# Patient Record
Sex: Female | Born: 1995 | Race: White | Hispanic: No | Marital: Married | Smoking: Former smoker
Health system: Southern US, Community
[De-identification: ages and names within clinical notes are randomized; demographics above are authoritative.]

## PROBLEM LIST (undated history)

## (undated) DIAGNOSIS — R7303 Prediabetes: Secondary | ICD-10-CM

## (undated) HISTORY — PX: WISDOM TOOTH EXTRACTION: SHX21

---

## 2020-03-14 ENCOUNTER — Emergency Department (HOSPITAL_COMMUNITY): Payer: BC Managed Care – PPO

## 2020-03-14 ENCOUNTER — Emergency Department (HOSPITAL_COMMUNITY)
Admission: EM | Admit: 2020-03-14 | Discharge: 2020-03-14 | Disposition: A | Discharge status: Home / Self Care | Payer: BC Managed Care – PPO | Attending: Emergency Medicine | Admitting: Emergency Medicine

## 2020-03-14 ENCOUNTER — Encounter (HOSPITAL_COMMUNITY): Payer: Self-pay

## 2020-03-14 ENCOUNTER — Other Ambulatory Visit: Payer: Self-pay

## 2020-03-14 DIAGNOSIS — S93401A Sprain of unspecified ligament of right ankle, initial encounter: Secondary | ICD-10-CM | POA: Insufficient documentation

## 2020-03-14 DIAGNOSIS — S99911A Unspecified injury of right ankle, initial encounter: Secondary | ICD-10-CM | POA: Diagnosis present

## 2020-03-14 DIAGNOSIS — X501XXA Overexertion from prolonged static or awkward postures, initial encounter: Secondary | ICD-10-CM | POA: Diagnosis not present

## 2020-03-14 LAB — I-STAT BETA HCG BLOOD, ED (MC, WL, AP ONLY): I-stat hCG, quantitative: <5 m[IU]/mL (ref <5)

## 2020-03-14 LAB — I-STAT CREATININE, ED: Creatinine, Ser: 0.7 mg/dL (ref 0.44–1.00)

## 2020-03-14 MED ORDER — IOHEXOL 350 MG/ML SOLN
100.0000 mL | Freq: Once | INTRAVENOUS | Status: AC | PRN
  Administered 2020-03-14: 100 mL via INTRAVENOUS

## 2020-03-14 NOTE — ED Provider Notes (Signed)
Contacted by the urgent care provider who sent this patient in for evaluation. XR of right ankle taken prior to sending to ED. I was called with results of XR, read as concern for ligamentous injury of the ankle. I do not have access to the images or report in Epic of via [facility]. Patient is still in the waiting room at this time.     Allayne Butcher, MD 03/14/20 1656    Terald Sleeper, MD 03/14/20 2145

## 2020-03-14 NOTE — ED Notes (Signed)
Pt discharged from ED in NAD, discussed discharge instructions with patient

## 2020-03-14 NOTE — Progress Notes (Signed)
Orthopedic Tech Progress Note Patient Details:  Kathleen Crawford 1995-07-29 [id number]  Ortho Devices Type of Ortho Device: ASO Ortho Device/Splint Location: Right Lower Extremity Ortho Device/Splint Interventions: Ordered, Application   Post Interventions Patient Tolerated: Well Instructions Provided: Adjustment of device, Care of device, Poper ambulation with device   Gerald Stabs 03/14/2020, 6:35 PM

## 2020-03-14 NOTE — ED Provider Notes (Signed)
[facility] EMERGENCY DEPARTMENT Provider Note   CSN: [id number] Arrival date & time: 03/14/20  1327     History No chief complaint on file.   Kathleen Crawford is a 24 y.o. female presenting for evaluation of right ankle sprain.  Patient states last night she missed a step, and sharply inverted her right ankle.  Since then, she has had pain on the lateral ankle.  She was seen at urgent care, there was concern for possible vascular injury due to coolness of the foot, burning, tingling.  Patient reports injury elsewhere, including knee, hip, or head.  She took ibuprofen this morning for pain, she has not taken anything else.  She has no other medical problems, takes no medications daily.  Does not have orthopedic doctor.  Pain is constant, worse with movement and palpation.  Nothing makes it better.  She reports a tingling sensation in the foot, but no true numbness.  HPI     History reviewed. No pertinent past medical history.  There are no problems to display for this patient.   History reviewed. No pertinent surgical history.   OB History   No obstetric history on file.     No family history on file.  Social History   Tobacco Use  . Smoking status: Not on file  Substance Use Topics  . Alcohol use: Not on file  . Drug use: Not on file    Home Medications Prior to Admission medications   Not on File    Allergies    Patient has no known allergies.  Review of Systems   Review of Systems  Musculoskeletal: Positive for arthralgias and joint swelling.  Neurological: Negative for numbness.    Physical Exam Updated Vital Signs BP 114/80 (BP Location: Left Arm)   Pulse 72   Temp 98.7 F (37.1 C) (Oral)   Resp 16   SpO2 100%   Physical Exam Vitals and nursing note reviewed.  Constitutional:      General: She is not in acute distress.    Appearance: She is well-developed.  HENT:     Head: Normocephalic and atraumatic.  Pulmonary:      Effort: Pulmonary effort is normal.  Abdominal:     General: There is no distension.  Musculoskeletal:        General: Swelling and tenderness present.     Cervical back: Normal range of motion.     Comments: Obvious swelling over the lateral malleolus of the right foot.  Tenderness palpation.  Pedal pulse 2+ bilaterally.  Good distal sensation and cap refill.  Patient reports decrease in station on the right foot, but no numbness.  Both feet are cool, right foot slightly cooler.  Achilles tendon is palpable and intact.  No pain on the medial malleolus.  No pain of the calcaneus or foot itself.  Skin:    General: Skin is warm.     Capillary Refill: Capillary refill takes less than 2 seconds.     Findings: No rash.  Neurological:     Mental Status: She is alert and oriented to person, place, and time.     ED Results / Procedures / Treatments   Labs (all labs ordered are listed, but only abnormal results are displayed) Labs Reviewed  I-STAT CREATININE, ED  I-STAT BETA HCG BLOOD, ED ([facility], [facility], [facility] ONLY)    EKG None  Radiology CT ANGIO LOW EXTREM LEFT W &/OR WO CONTRAST  Result Date: 03/14/2020 CLINICAL DATA:  Rolled ankle,  right foot numbness, evaluate for vascular injury EXAM: CT ANGIOGRAPHY LOWER RIGHT EXTREMITY; CT ANGIOGRAPHY LOWER LEFT EXTREMITY TECHNIQUE: CT angiogram of the bilateral lower extremities was performed following the intravenous injection of iodinated contrast. Multiplanar reformats were performed. CONTRAST:  OMNIPAQUE IOHEXOL 350 MG/ML SOLN COMPARISON:  None. FINDINGS: Vascular: Normal contour and caliber of the bilateral lower extremity arterial vasculature. Three-vessel runoff to the ankles bilaterally. No evidence of traumatic arterial vascular injury to the lower extremities. Musculoskeletal: Soft tissue edema about the lateral ankle. No fracture or dislocation. Unremarkable appearance of the musculature and soft tissues of the lower extremities. Review of the  MIP images confirms the above findings. IMPRESSION: 1. Normal contour and caliber of the bilateral lower extremity arterial vasculature. Three-vessel runoff to the ankles bilaterally. No evidence of traumatic arterial vascular injury to the lower extremities. 2. Soft tissue edema about the lateral ankle. Consider MRI on a nonemergent basis to assess for ligamentous injury. 3. No fracture or dislocation. Electronically Signed   By: Lauralyn Primes M.D.   On: 03/14/2020 17:29   CT ANGIO LOW EXTREM RIGHT W &/OR WO CONTRAST  Result Date: 03/14/2020 CLINICAL DATA:  Rolled ankle, right foot numbness, evaluate for vascular injury EXAM: CT ANGIOGRAPHY LOWER RIGHT EXTREMITY; CT ANGIOGRAPHY LOWER LEFT EXTREMITY TECHNIQUE: CT angiogram of the bilateral lower extremities was performed following the intravenous injection of iodinated contrast. Multiplanar reformats were performed. CONTRAST:  OMNIPAQUE IOHEXOL 350 MG/ML SOLN COMPARISON:  None. FINDINGS: Vascular: Normal contour and caliber of the bilateral lower extremity arterial vasculature. Three-vessel runoff to the ankles bilaterally. No evidence of traumatic arterial vascular injury to the lower extremities. Musculoskeletal: Soft tissue edema about the lateral ankle. No fracture or dislocation. Unremarkable appearance of the musculature and soft tissues of the lower extremities. Review of the MIP images confirms the above findings. IMPRESSION: 1. Normal contour and caliber of the bilateral lower extremity arterial vasculature. Three-vessel runoff to the ankles bilaterally. No evidence of traumatic arterial vascular injury to the lower extremities. 2. Soft tissue edema about the lateral ankle. Consider MRI on a nonemergent basis to assess for ligamentous injury. 3. No fracture or dislocation. Electronically Signed   By: Lauralyn Primes M.D.   On: 03/14/2020 17:29    Procedures Procedures (including critical care time)  Medications Ordered in ED Medications    iohexol (OMNIPAQUE) 350 MG/ML injection 100 mL (100 mLs Intravenous Contrast Given 03/14/20 1635)    ED Course  I have reviewed the triage vital signs and the nursing notes.  Pertinent labs & imaging results that were available during my care of the patient were reviewed by me and considered in my medical decision making (see chart for details).    MDM Rules/Calculators/A&P                          Patient presenting for evaluation of right ankle pain.  On exam, patient appears nontoxic.  She is neurovascularly intact.  The patient's foot is cool, this is likely due to lack of use.  Patient has a good pedal pulse.  CTA obtained from triage without signs of vascular injury or subtle bony injury.  Cannot rule out ligamentous injury.  Discussed findings with patient.  Discussed symptomatic treatment with rice therapy and ASO.  Encouraged patient to follow-up with orthopedics as needed.  At this time, patient appears safe for discharge.  Return precautions given.  Patient states she understands and agrees to plan.  Final Clinical  Impression(s) / ED Diagnoses Final diagnoses:  Sprain of right ankle, unspecified ligament, initial encounter    Rx / DC Orders ED Discharge Orders    None       Alveria Apley, PA-C 03/14/20 1759    Virgina Norfolk, DO 03/14/20 1832

## 2020-03-14 NOTE — Discharge Instructions (Addendum)
Take ibuprofen 3 times a day with meals.  Do not take other anti-inflammatories at the same time (Advil, Motrin, naproxen, Aleve). You may supplement with Tylenol if you need further pain control. Use ice packs, 20 minutes at a time, up to 3 times a day as needed for pain. Keep your foot elevated to help with pain and swelling. Use the ankle brace as needed for pain control. I recommend you use crutches in the first couple days to help with pain control.  After this, try to slowly progress weightbearing as tolerated. Follow-up with orthopedic doctor in 1 week if your symptoms not proving. Return to emergency room with any new, worsening, concerning symptoms.

## 2020-03-14 NOTE — ED Triage Notes (Signed)
Patient sent from urgent care for further evaluation of right ankle injury that occurred last night after rolling ankle. Xray negative per patient. Extremity cool to touch, positive distal pulse. Complains of numbness and burning

## 2020-03-14 NOTE — ED Provider Notes (Signed)
MSE was initiated and I personally evaluated the patient and placed orders (if any) at  2:50 PM on March 14, 2020.  24 year old female presents to the emergency department from urgent care.  Sent for evaluation of possible vascular injury to the right lower extremity.  States that she rolled her ankle last night at her brother's 22st birthday.  Went for x-rays at urgent care today and was told they were negative.  Provider expressed concern about possible vascular injury due to cool extremity and delayed capillary refill.  On my assessment, the patient does have some slight coolness of the right foot as compared to the left.  Her capillary refill in her lesser toes is slightly delayed, but she has a strong, palpable 2+ DP pulse bilaterally.  There is minimal swelling about the right lateral malleolus.  No crepitus or deformity.  Suspect there may be a degree of Raynaud's phenomenon affecting her distal right foot.  Despite low suspicion, will obtain CTA of the RLE to exclude arterial injury; will also allow for further bony trauma. If negative, feel the patient can be discharged with RICE precautions.  The patient appears stable so that the remainder of the MSE may be completed by another provider.   Antony Madura, PA-C 03/14/20 1453    Gerhard Munch, MD 03/14/20 [phone]

## 2022-01-05 IMAGING — CT CT ANGIO EXTREM LOW*L*
2 of 8 series · 14 of 46 positions shown, 19 images · IV contrast (APPLIED)
Comparison: None.

CLINICAL DATA: Rolled ankle, right foot numbness, evaluate for
vascular injury

EXAM:
CT ANGIOGRAPHY LOWER RIGHT EXTREMITY; CT ANGIOGRAPHY LOWER LEFT
EXTREMITY
TECHNIQUE: CT angiogram of the bilateral lower extremities was performed
following the intravenous injection of iodinated contrast.
Multiplanar reformats were performed.
CONTRAST:  100mL OMNIPAQUE IOHEXOL 350 MG/ML SOLN

[Series 6: angiorunoff 1.0 i30f 3 · axial · 0.98mm/px · z∈[-1256,-236]mm · 12 of 1174 slices shown, 16 images]
[im 103/1174  soft-tissue]
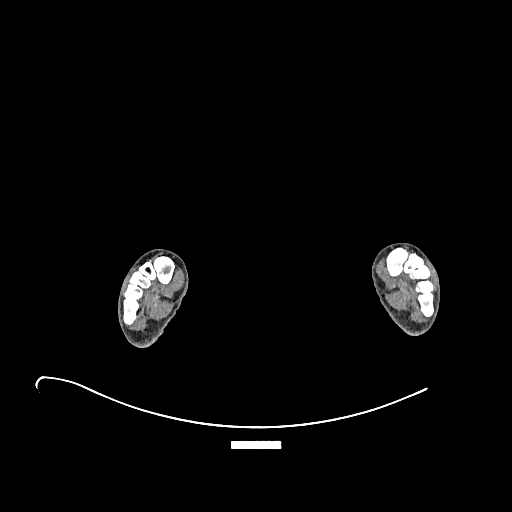
[im 103/1174  bone]
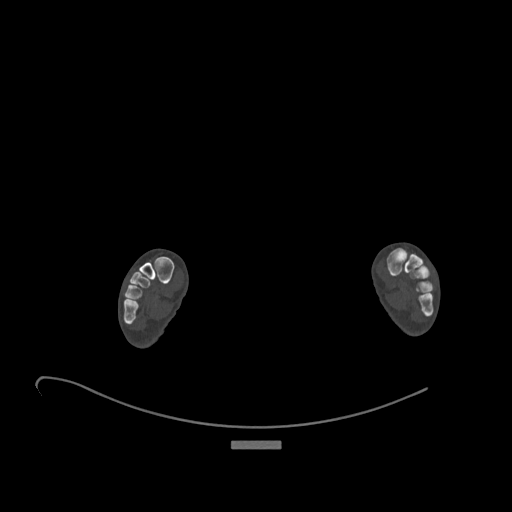
[im 205/1174  soft-tissue]
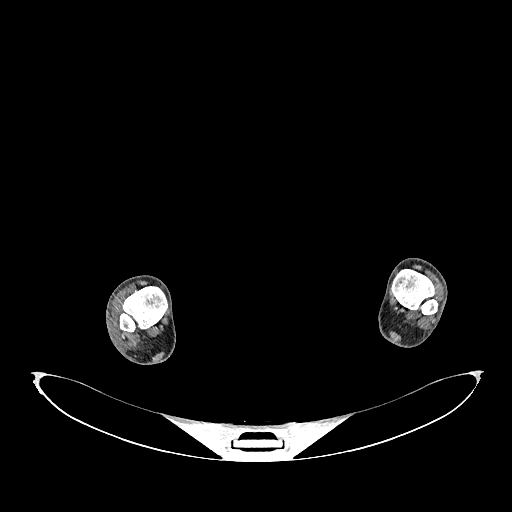
[im 307/1174  soft-tissue]
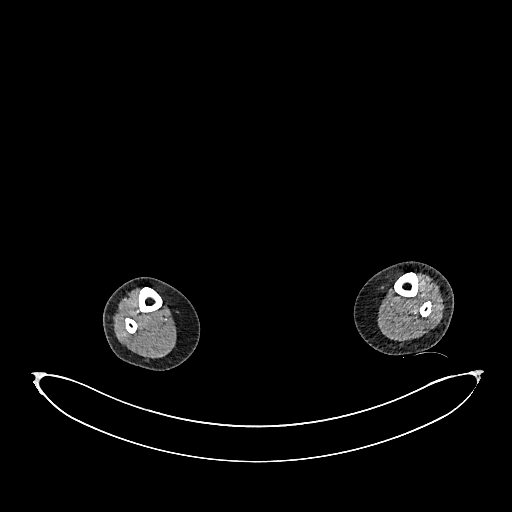
[im 409/1174  soft-tissue]
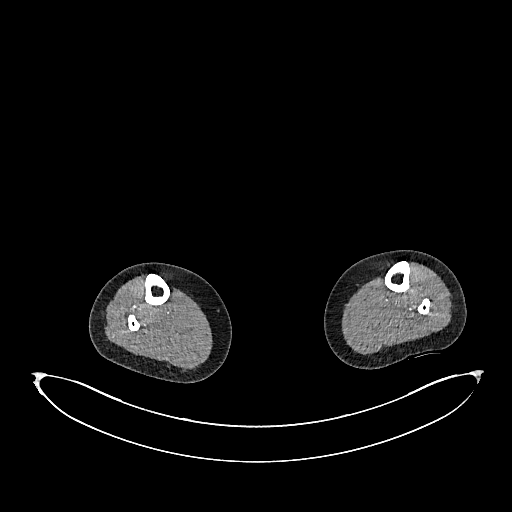
[im 511/1174  soft-tissue]
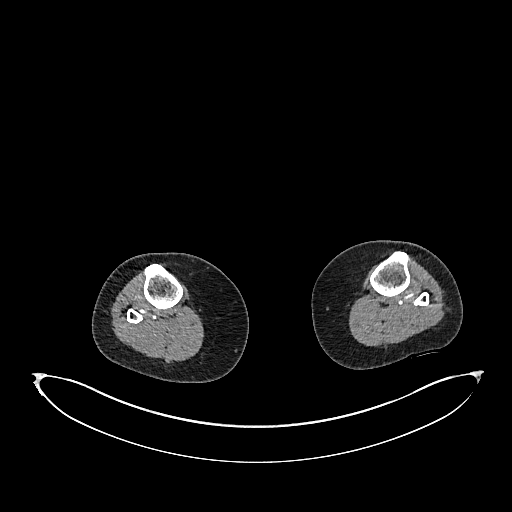
[im 664/1174  soft-tissue]
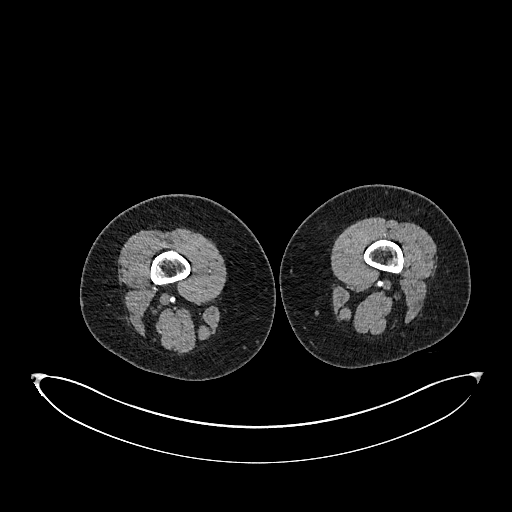
[im 766/1174  soft-tissue]
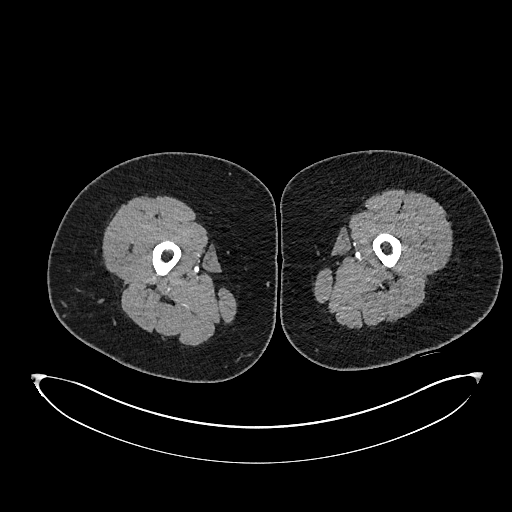
[im 868/1174  soft-tissue]
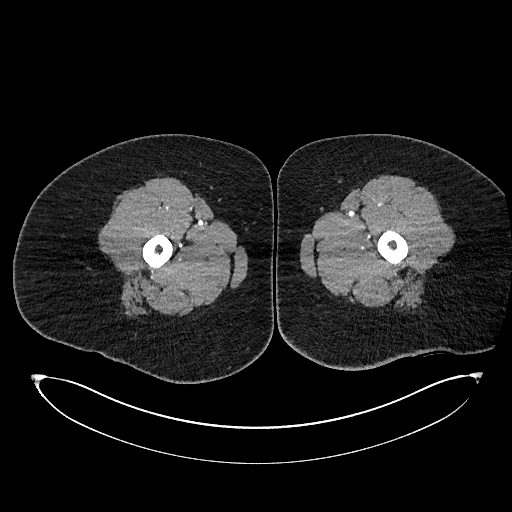
[im 970/1174  soft-tissue]
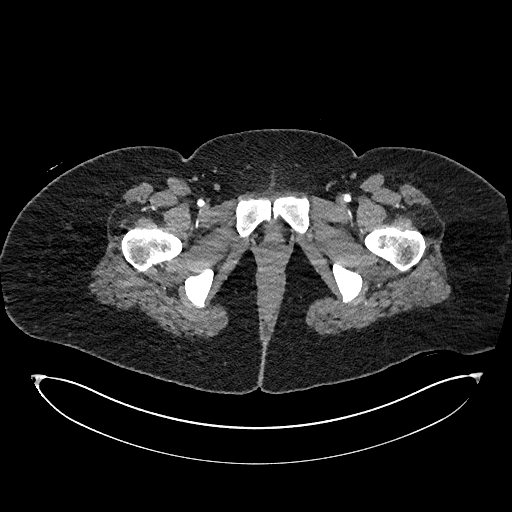
[im 970/1174  lung]
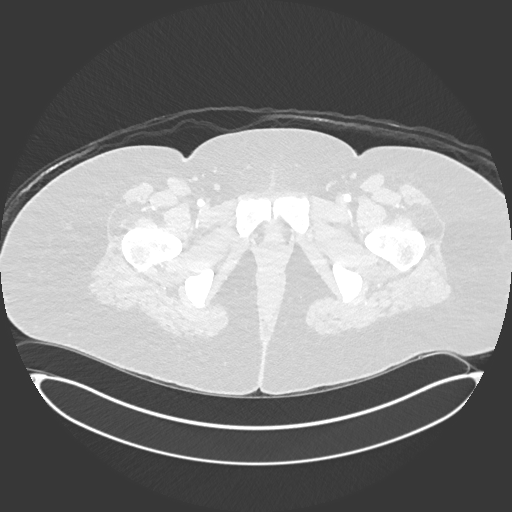
[im 970/1174  bone]
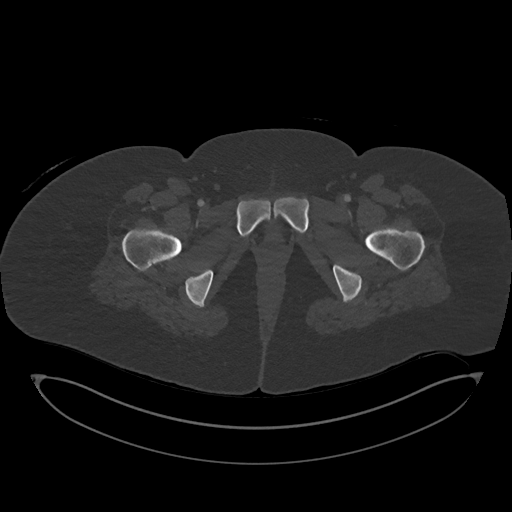
[im 1021/1174  lung]
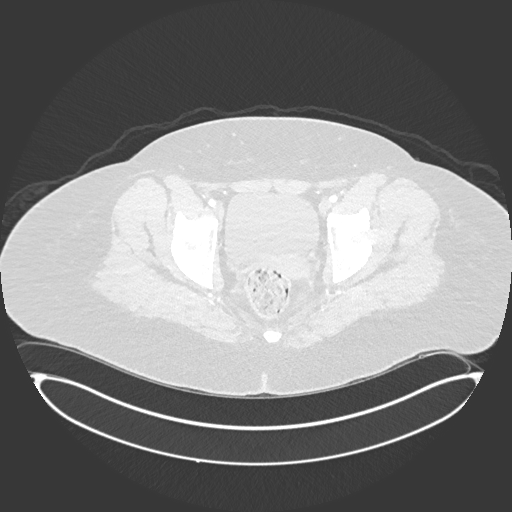
[im 1072/1174  soft-tissue]
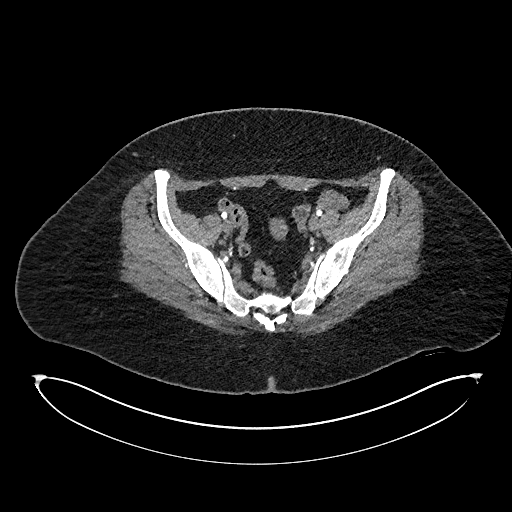
[im 1072/1174  lung]
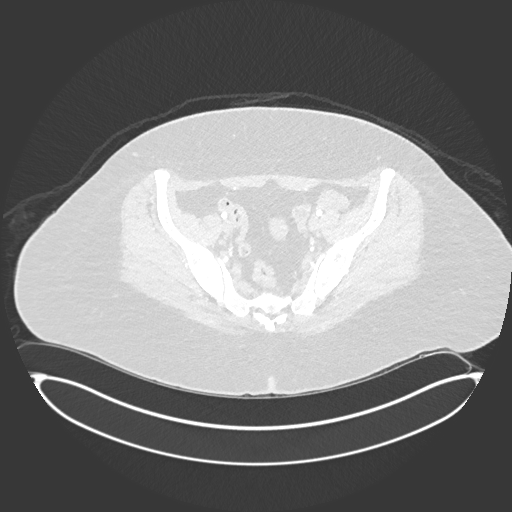
[im 1123/1174  lung]
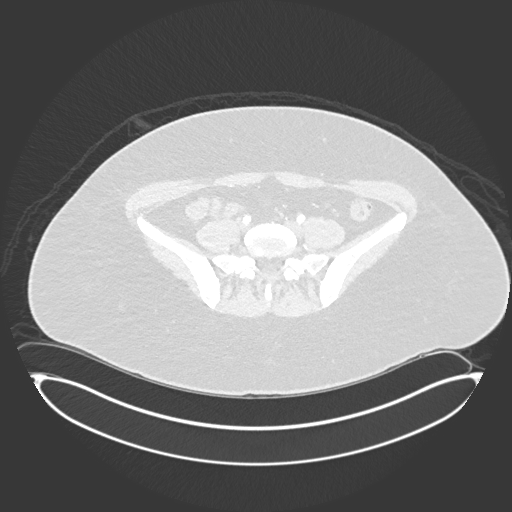

[Series 7: coronal upper · coronal · 0.98mm/px · 2 of 151 slices shown, 3 images]
[im 51/151  soft-tissue]
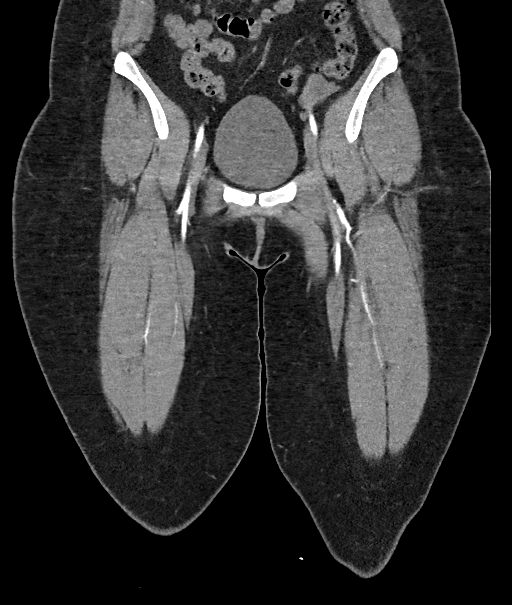
[im 51/151  bone]
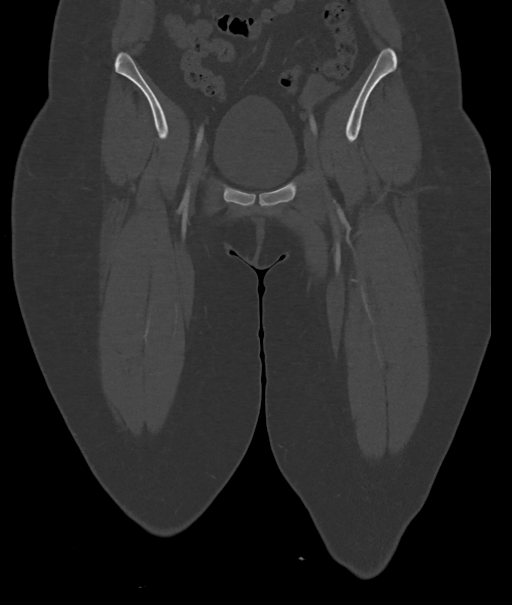
[im 101/151  soft-tissue]
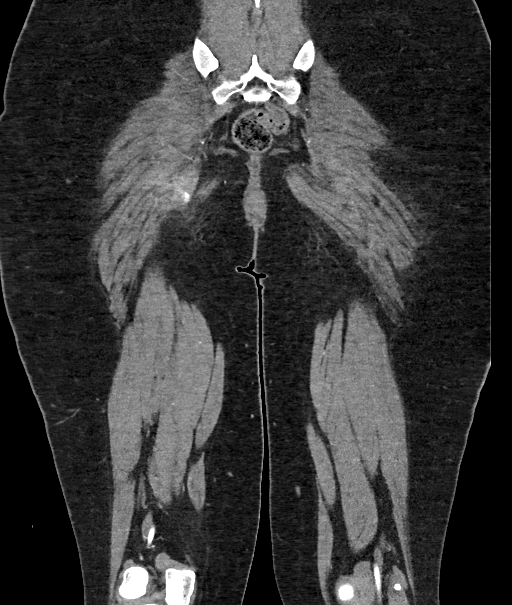

[14 of 46 positions shown; findings below may reference images not displayed]

FINDINGS: Vascular: Normal contour and caliber of the bilateral lower
extremity arterial vasculature. Three-vessel runoff to the ankles
bilaterally. No evidence of traumatic arterial vascular injury to
the lower extremities.

Musculoskeletal: Soft tissue edema about the lateral ankle. No
fracture or dislocation. Unremarkable appearance of the musculature
and soft tissues of the lower extremities.

Review of the MIP images confirms the above findings.
IMPRESSION: 1. Normal contour and caliber of the bilateral lower extremity
arterial vasculature. Three-vessel runoff to the ankles bilaterally.
No evidence of traumatic arterial vascular injury to the lower
extremities.
2. Soft tissue edema about the lateral ankle. Consider MRI on a
nonemergent basis to assess for ligamentous injury.
3. No fracture or dislocation.

## 2022-09-18 ENCOUNTER — Ambulatory Visit: Payer: Self-pay | Admitting: Surgery

## 2022-09-18 ENCOUNTER — Other Ambulatory Visit (HOSPITAL_COMMUNITY): Payer: Self-pay | Admitting: Surgery

## 2022-09-26 ENCOUNTER — Encounter: Payer: Self-pay | Admitting: Dietician

## 2022-09-26 ENCOUNTER — Encounter: Payer: BC Managed Care – PPO | Attending: Surgery | Admitting: Dietician

## 2022-09-26 VITALS — Ht 67.0 in | Wt 294.8 lb

## 2022-09-26 DIAGNOSIS — E669 Obesity, unspecified: Secondary | ICD-10-CM | POA: Diagnosis present

## 2022-09-26 NOTE — Progress Notes (Signed)
Nutrition Assessment for Bariatric Surgery: Pre-Surgery Behavioral and Nutrition Intervention Program   Medical Nutrition Therapy  Appt Start Time: 8:38    End Time: 9:36  Patient was seen on 09/26/2022 for Pre-Operative Nutrition Assessment. Purpose of todays visit  enhance perioperative outcomes along with a healthy weight maintenance   Referral stated Supervised Weight Loss (SWL) visits needed: 0  Pt completed visits.   Pt has cleared nutrition requirements.   Planned surgery: RYGB Pt expectation of surgery: to lose 100 lbs.   NUTRITION ASSESSMENT   Anthropometrics  Start weight at [facility]: 294.8 lbs (date: 09/26/2022)  Height: 67 in BMI: 46.17 kg/m2     Clinical   Pharmacotherapy: History of weight loss medication used: phentermine, Wegovy (one month)  Medical hx: obesity Medications: none  Labs: A1c 5.9 (one year ago); HDL 52 (one year ago) Notable signs/symptoms: none noted Any previous deficiencies? No  Evaluation of Nutritional Deficiencies: Micronutrient Nutrition Focused Physical Exam: Hair: No issues observed Eyes: No issues observed Mouth: No issues observed Neck: No issues observed Nails: No issues observed Skin: No issues observed  Lifestyle & Dietary Hx  Pt states she quit smoking in 2018, stating she quit vapeing last month.  Pt states she stopped drinking soda a month ago.  Current Physical Activity Recommendations state 150 minutes per week of moderate to vigorous movement including Cardio and 1-2 days of resistance activities as well as flexibility/balance activities:  Pts current physical activity: 3 days a week, softball practice/coach, 1.5 hours, with 100% recommendation reached   Sleep Hygiene: duration and quality: difficult to stay asleep, sleeping about 6 hours a night  Current Patient Perceived Stress Level as stated by pt on a scale of 1-10:  7       Stress Management Techniques: therapy 2 times a week  According to the Dietary  Guidelines for Americans Recommendation: equivalent 1.5-2 cups fruits per day, equivalent 2-3 cups vegetables per day and at least half all grains whole  Fruit servings per day (on average): 0, meeting 0% recommendation  Non-starchy vegetable servings per day (on average): 1, meeting 33-50% recommendation  Whole Grains per day (on average): 0  Number of meals missed/skipped per week out of 21: 7-10  24-Hr Dietary Recall First Meal: protein coffee Snack: granola bar Second Meal: lunchables (homemade), chicken sausage, string cheese, yogurt, boiled eggs (2) Snack: popcorn (100 calorie kettle corn by [employer]) Third Meal: chicken nuggets or salad or skip Snack:  Beverages: coffee (with protein), water, energy drink, lemonade.  Alcoholic beverages per week: (one drink every six months)   Estimated Energy Needs Calories: 1500   NUTRITION DIAGNOSIS  Overweight/obesity ([place]-3.3) related to past poor dietary habits and physical inactivity as evidenced by patient w/ planned RYGB surgery following dietary guidelines for continued weight loss.   NUTRITION INTERVENTION  Nutrition counseling (C-1) and education (E-2) to facilitate bariatric surgery goals.  Educated pt on micronutrient deficiencies post-surgery and behavioral/dietary strategies to start in order to mitigate that risk   Behavioral and Dietary Interventions Pre-Op Goals Reviewed with the Patient Nutrition: Healthy Eating Behaviors Switch to non-caloric, non-carbonated and non-caffeinated beverages such as  water, unsweetened tea, Crystal Light and zero calorie beverages (aim for 64 oz. per day) Cut out grazing between meals or at night  Find a protein shake you like Eat every 3-5 hours; eating breakfast        Eliminate distractions while eating (TV, computer, reading, driving, texting) Take 30-86 minutes to eat a meal  Decrease high sugar  foods/decrease high fat/fried foods Eliminate alcoholic beverages Increase protein  intake (eggs, fish, chicken, yogurt) before surgery Eat non starchy vegetables 2 times a day 7 days a week Eat complex carbohydrates such as whole grains and fruits   Behavioral Modification: Physical Activity Increase my usual daily activity (use stairs, park farther, etc.) Engage in _______walking the new puppy________________  activity  __30 minutes_____ minutes ___daily___ times per week  Other:    _________________________________________________________________     Problem Solving I will think about my usual eating patterns and how to tweak them How can my friends and family support me Barriers to starting my changes Learn and understand appetite verses hunger   Healthy Coping Allow for ___________ activities per week to help me manage stress Reframe negative thoughts I will keep a picture of someone or something that is my inspiration & look at it daily   Monitoring  Weigh myself once a week  Measure my progress by monitoring how my clothes fit Keep a food record of what I eat and drink for the next ________ (time period) Take pictures of what I eat and drink for the next ________ (time period) Use an app to count steps/day for the next_______ (time period) Measure my progress such as increased energy and more restful sleep Monitor your acid reflux and bowel habits, are they getting better?   *Goals that are bolded indicate the pt would like to start working towards these  Handouts Provided Include  Bariatric Surgery handouts (Nutrition Visits, Pre Surgery Behavioral Change Goals, Protein Shakes Brands to Choose From, Vitamins & Mineral Supplementation)  Learning Style & Readiness for Change Teaching method utilized: Visual, Auditory, and hands on  Demonstrated degree of understanding via: Teach Back  Readiness Level: preparation Barriers to learning/adherence to lifestyle change: nothing identified  RD's Notes for Next Visit   MONITORING & EVALUATION Dietary intake,  weekly physical activity, body weight, and preoperative behavioral change goals   Next Steps  Pt has completed visits. No further supervised visits required/recommended. Patient is to follow up at [facility] for pre-op class, >2 weeks prior to scheduled surgery.

## 2022-09-29 ENCOUNTER — Encounter (HOSPITAL_COMMUNITY): Payer: Self-pay | Admitting: Surgery

## 2022-10-05 ENCOUNTER — Other Ambulatory Visit: Payer: Self-pay

## 2022-10-05 ENCOUNTER — Ambulatory Visit (HOSPITAL_COMMUNITY)
Admission: RE | Admit: 2022-10-05 | Discharge: 2022-10-05 | Disposition: A | Discharge status: Home / Self Care | Payer: BC Managed Care – PPO | Source: Ambulatory Visit | Attending: Surgery | Admitting: Surgery

## 2022-10-05 ENCOUNTER — Encounter (HOSPITAL_COMMUNITY)
Admission: RE | Admit: 2022-10-05 | Discharge: 2022-10-05 | Disposition: A | Discharge status: Home / Self Care | Payer: BC Managed Care – PPO | Source: Ambulatory Visit | Attending: Surgery | Admitting: Surgery

## 2022-10-05 DIAGNOSIS — Z01818 Encounter for other preprocedural examination: Secondary | ICD-10-CM

## 2022-10-05 NOTE — Anesthesia Preprocedure Evaluation (Addendum)
Anesthesia Evaluation  Patient identified by MRN, date of birth, ID band Patient awake    Reviewed: Allergy & Precautions, H&P , NPO status , Patient's Chart, lab work & pertinent test results  Airway Mallampati: II  TM Distance: >3 FB Neck ROM: Full    Dental no notable dental hx.    Pulmonary neg pulmonary ROS, former smoker   Pulmonary exam normal breath sounds clear to auscultation       Cardiovascular Exercise Tolerance: Good negative cardio ROS Normal cardiovascular exam Rhythm:Regular Rate:Normal     Neuro/Psych negative neurological ROS  negative psych ROS   GI/Hepatic negative GI ROS, Neg liver ROS,GERD  ,,  Endo/Other  negative endocrine ROS  Morbid obesity  Renal/GU negative Renal ROS  negative genitourinary   Musculoskeletal negative musculoskeletal ROS (+)    Abdominal   Peds negative pediatric ROS (+)  Hematology negative hematology ROS (+)   Anesthesia Other Findings   Reproductive/Obstetrics negative OB ROS                             Anesthesia Physical Anesthesia Plan  ASA: 3  Anesthesia Plan: MAC   Post-op Pain Management:    Induction:   PONV Risk Score and Plan: Treatment may vary due to age or medical condition  Airway Management Planned: Natural Airway and Nasal Cannula  Additional Equipment: None  Intra-op Plan:   Post-operative Plan:   Informed Consent: I have reviewed the patients History and Physical, chart, labs and discussed the procedure including the risks, benefits and alternatives for the proposed anesthesia with the patient or authorized representative who has indicated his/her understanding and acceptance.     Dental advisory given  Plan Discussed with: Anesthesiologist and CRNA  Anesthesia Plan Comments: (EGD for reflux)       Anesthesia Quick Evaluation

## 2022-10-06 ENCOUNTER — Ambulatory Visit (HOSPITAL_COMMUNITY): Payer: BC Managed Care – PPO | Admitting: Anesthesiology

## 2022-10-06 ENCOUNTER — Encounter (HOSPITAL_COMMUNITY): Payer: Self-pay | Admitting: Surgery

## 2022-10-06 ENCOUNTER — Ambulatory Visit (HOSPITAL_COMMUNITY)
Admission: RE | Admit: 2022-10-06 | Discharge: 2022-10-06 | Disposition: A | Discharge status: Home / Self Care | Payer: BC Managed Care – PPO | Attending: Surgery | Admitting: Surgery

## 2022-10-06 ENCOUNTER — Other Ambulatory Visit: Payer: Self-pay

## 2022-10-06 ENCOUNTER — Encounter (HOSPITAL_COMMUNITY)
Admission: RE | Disposition: A | Discharge status: Home / Self Care | Payer: Self-pay | Source: Home / Self Care | Attending: Surgery

## 2022-10-06 DIAGNOSIS — Z87891 Personal history of nicotine dependence: Secondary | ICD-10-CM | POA: Insufficient documentation

## 2022-10-06 DIAGNOSIS — Z6841 Body Mass Index (BMI) 40.0 and over, adult: Secondary | ICD-10-CM | POA: Diagnosis not present

## 2022-10-06 DIAGNOSIS — Z01818 Encounter for other preprocedural examination: Secondary | ICD-10-CM | POA: Insufficient documentation

## 2022-10-06 DIAGNOSIS — K219 Gastro-esophageal reflux disease without esophagitis: Secondary | ICD-10-CM | POA: Diagnosis not present

## 2022-10-06 HISTORY — PX: ESOPHAGOGASTRODUODENOSCOPY: SHX5428

## 2022-10-06 HISTORY — PX: BIOPSY: SHX5522

## 2022-10-06 LAB — PREGNANCY, URINE: Preg Test, Ur: NEGATIVE

## 2022-10-06 SURGERY — EGD (ESOPHAGOGASTRODUODENOSCOPY)
Anesthesia: Monitor Anesthesia Care

## 2022-10-06 MED ORDER — LACTATED RINGERS IV SOLN
INTRAVENOUS | Status: AC | PRN
  Administered 2022-10-06: 1000 mL via INTRAVENOUS

## 2022-10-06 MED ORDER — ONDANSETRON HCL 4 MG/2ML IJ SOLN
INTRAMUSCULAR | Status: DC | PRN
  Administered 2022-10-06: 4 mg via INTRAVENOUS

## 2022-10-06 MED ORDER — MIDAZOLAM HCL 5 MG/5ML IJ SOLN
INTRAMUSCULAR | Status: DC | PRN
  Administered 2022-10-06: 2 mg via INTRAVENOUS

## 2022-10-06 MED ORDER — PROPOFOL 500 MG/50ML IV EMUL
INTRAVENOUS | Status: DC | PRN
  Administered 2022-10-06: 155 ug/kg/min via INTRAVENOUS
  Administered 2022-10-06: 70 mg via INTRAVENOUS

## 2022-10-06 MED ORDER — MIDAZOLAM HCL 2 MG/2ML IJ SOLN
INTRAMUSCULAR | Status: AC
  Filled 2022-10-06: qty 2

## 2022-10-06 NOTE — Transfer of Care (Signed)
Immediate Anesthesia Transfer of Care Note  Patient: Kathleen Crawford  Procedure(s) Performed: Procedure(s): ESOPHAGOGASTRODUODENOSCOPY (EGD) (N/A) BIOPSY  Patient Location: PACU  Anesthesia Type:MAC  Level of Consciousness:  sedated, patient cooperative and responds to stimulation  Airway & Oxygen Therapy:Patient Spontanous Breathing and Patient connected to face mask oxgen  Post-op Assessment:  Report given to PACU RN and Post -op Vital signs reviewed and stable  Post vital signs:  Reviewed and stable  Last Vitals:  Vitals:   10/06/22 1039  BP: 121/69  Pulse: 65  Resp: 20  Temp: 36.4 C  SpO2: 100%    Complications: No apparent anesthesia complications

## 2022-10-06 NOTE — Op Note (Signed)
[facility] Patient Name: Kathleen Crawford Procedure Date: 10/06/2022 MRN: [id number] Attending MD: Quentin Ore , ,  Date of Birth: May 12, 1996 CSN: [id number] Age: 27 Admit Type: Outpatient Procedure:                Upper GI endoscopy Indications:               Providers:                Quentin Ore, Fransisca Connors, Leanne Lovely, Technician, Greig Right, CRNA Referring MD:              Medicines:                Monitored Anesthesia Care Complications:            No immediate complications. Estimated Blood Loss:     Estimated blood loss was minimal. Procedure:                Pre-Anesthesia Assessment:                           - Prior to the procedure, a History and Physical                            was performed, and patient medications and                            allergies were reviewed. The patient is competent.                            The risks and benefits of the procedure and the                            sedation options and risks were discussed with the                            patient. All questions were answered and informed                            consent was obtained. Patient identification and                            proposed procedure were verified in the                            pre-procedure area. Mental Status Examination:                            alert and oriented. Airway Examination: normal                            oropharyngeal airway and neck mobility. Respiratory                            Examination: clear to  auscultation. CV Examination:                            normal. Prophylactic Antibiotics: The patient does                            not require prophylactic antibiotics. Prior                            Anticoagulants: The patient has taken no                            anticoagulant or antiplatelet agents. ASA Grade                            Assessment: II - A patient  with mild systemic                            disease. After reviewing the risks and benefits,                            the patient was deemed in satisfactory condition to                            undergo the procedure. The anesthesia plan was to                            use monitored anesthesia care (MAC). Immediately                            prior to administration of medications, the patient                            was re-assessed for adequacy to receive sedatives.                            The heart rate, respiratory rate, oxygen                            saturations, blood pressure, adequacy of pulmonary                            ventilation, and response to care were monitored                            throughout the procedure. The physical status of                            the patient was re-assessed after the procedure.                           After obtaining informed consent, the endoscope was  passed under direct vision. Throughout the                            procedure, the patient's blood pressure, pulse, and                            oxygen saturations were monitored continuously. The                            [id number]-[id number] ([id number] Olympus endoscope was introduced                            through the mouth, and advanced to the second part                            of duodenum. The upper GI endoscopy was                            accomplished without difficulty. The patient                            tolerated the procedure well. Scope In: Scope Out: Findings:      The gastroesophageal flap valve was visualized endoscopically and       classified as Hill Grade I (prominent fold, tight to endoscope).      The entire examined stomach was normal. Biopsies were taken with a cold       forceps for Helicobacter pylori testing. Verification of patient       identification for the specimen was done. Estimated blood loss was        minimal.      The examined duodenum was normal. Impression:               - Gastroesophageal flap valve classified as Hill                            Grade I (prominent fold, tight to endoscope).                           - Normal stomach. Biopsied.                           - Normal examined duodenum. Moderate Sedation:      Moderate (conscious) sedation was personally administered by an       anesthesia professional. The following parameters were monitored: oxygen       saturation, heart rate, blood pressure, and response to care. Total       physician intraservice time was 15 minutes. Recommendation:           - Discharge patient to home.                           - Resume previous diet. Procedure Code(s):        --- Professional ---                           [phone], Esophagogastroduodenoscopy, flexible,  transoral; with biopsy, single or multiple Diagnosis Code(s):        --- Professional ---                           M57.846, Encounter for other preprocedural                            examination CPT copyright 2022 [employer]. All rights reserved. The codes documented in this report are preliminary and upon coder review may  be revised to meet current compliance requirements. Kathleen Crawford,  10/06/2022 12:49:23 PM This report has been signed electronically. Number of Addenda: 0

## 2022-10-06 NOTE — Anesthesia Postprocedure Evaluation (Signed)
Anesthesia Post Note  Patient: Kathleen Crawford  Procedure(s) Performed: ESOPHAGOGASTRODUODENOSCOPY (EGD) BIOPSY     Patient location during evaluation: Endoscopy Anesthesia Type: MAC Level of consciousness: awake and alert Pain management: pain level controlled Vital Signs Assessment: post-procedure vital signs reviewed and stable Respiratory status: spontaneous breathing, nonlabored ventilation, respiratory function stable and patient connected to nasal cannula oxygen Cardiovascular status: blood pressure returned to baseline and stable Postop Assessment: no apparent nausea or vomiting Anesthetic complications: no  No notable events documented.  Last Vitals:  Vitals:   10/06/22 1300 10/06/22 1315  BP: (!) 148/69 (!) 147/88  Pulse:  64  Resp:  20  Temp:    SpO2:      Last Pain:  Vitals:   10/06/22 1039  TempSrc: Tympanic  PainSc: 0-No pain                 Trevor Iha

## 2022-10-06 NOTE — Discharge Instructions (Signed)
April Staton from [facility] will be in contact with you for next steps towards scheduling surgery.  Please call the office with any questions.

## 2022-10-06 NOTE — H&P (Signed)
   Admitting Physician: Hyman Hopes Haiven Nardone  Service: Bariatric surgery  CC: EGD prior to bariatric surgery  Subjective   HPI: Kathleen Crawford is an 27 y.o. female who is here for EGD prior to bariatric surgery  History reviewed. No pertinent past medical history.  History reviewed. No pertinent surgical history.  History reviewed. No pertinent family history.  Social:  reports that she has quit smoking. Her smoking use included cigarettes. She has never used smokeless tobacco. No history on file for alcohol use and drug use.  Allergies: No Known Allergies  Medications: No current outpatient medications  ROS - all of the below systems have been reviewed with the patient and positives are indicated with bold text General: chills, fever or night sweats Eyes: blurry vision or double vision ENT: epistaxis or sore throat Allergy/Immunology: itchy/watery eyes or nasal congestion Hematologic/Lymphatic: bleeding problems, blood clots or swollen lymph nodes Endocrine: temperature intolerance or unexpected weight changes Breast: new or changing breast lumps or nipple discharge Resp: cough, shortness of breath, or wheezing CV: chest pain or dyspnea on exertion GI: as per HPI GU: dysuria, trouble voiding, or hematuria MSK: joint pain or joint stiffness Neuro: TIA or stroke symptoms Derm: pruritus and skin lesion changes Psych: anxiety and depression  Objective   PE There were no vitals taken for this visit. Constitutional: NAD; conversant; no deformities Eyes: Moist conjunctiva; no lid lag; anicteric; PERRL Neck: Trachea midline; no thyromegaly Lungs: Normal respiratory effort; no tactile fremitus CV: RRR; no palpable thrills; no pitting edema GI: Abd Soft, nontender; no palpable hepatosplenomegaly MSK: Normal range of motion of extremities; no clubbing/cyanosis Psychiatric: Appropriate affect; alert and oriented x3 Lymphatic: No palpable cervical or axillary  lymphadenopathy  No results found for this or any previous visit (from the past 24 hour(s)).  Imaging Orders  No imaging studies ordered today     Assessment and Plan    Kathleen Crawford is a 27 y.o. female who is seen for bariatric surgery consultation. The patient has morbid obesity with a BMI of Body mass index is 46 kg/m. and the following conditions related to obesity: None.  Today we discussed the surgical options to treat obesity and its associated comorbidity. After discussing the available procedures in the region, we discussed in great detail the surgeries I offer: robotic sleeve gastrectomy and robotic roux-en-y gastric bypass. We discussed the procedures themselves as well as their risks, benefits and alternatives. I entered the patient's basic information into the [facility] Metabolic Surgery Risk/Benefit Calculator to facilitate this discussion.  After a full discussion and all questions answered, the patient is interested in pursuing a robotic gastric bypass with upper endoscopy.  We will initiate the bariatric surgery preoperative pathway to include the following: - Bloodwork - Dietician consult - Chest x-ray - EKG - Psychology evaluation - Upper endoscopy with biopsy. I explained my rational for performing upper endoscopy in my bariatric patients. During the procedure I will biopsy for H. Pylori, evaluate for hiatal hernia, evaluate for reflux esophagitis and look for any other abnormalities that may influence the procedure. We discussed the risks, benefits and alternative to this procedure and the patient granted consent to proceed.    Today she presents for EGD.  We will proceed as scheduled.     Quentin Ore, MD  [facility] Use [email or web address] to contact on call provider

## 2022-10-09 ENCOUNTER — Encounter (HOSPITAL_COMMUNITY): Payer: Self-pay | Admitting: Surgery

## 2022-10-09 LAB — SURGICAL PATHOLOGY

## 2022-12-05 ENCOUNTER — Ambulatory Visit: Payer: Self-pay | Admitting: Surgery

## 2022-12-05 DIAGNOSIS — Z01818 Encounter for other preprocedural examination: Secondary | ICD-10-CM

## 2022-12-05 NOTE — Progress Notes (Signed)
Surgery orders requested via Epic inbox. °

## 2022-12-11 ENCOUNTER — Encounter: Payer: BC Managed Care – PPO | Attending: Surgery | Admitting: Skilled Nursing Facility1

## 2022-12-11 ENCOUNTER — Encounter: Payer: Self-pay | Admitting: Skilled Nursing Facility1

## 2022-12-11 VITALS — Wt 311.7 lb

## 2022-12-11 DIAGNOSIS — E669 Obesity, unspecified: Secondary | ICD-10-CM | POA: Insufficient documentation

## 2022-12-11 NOTE — Progress Notes (Signed)
Pre-Operative Nutrition Class:    Patient was seen on 12/11/2022 for Pre-Operative Bariatric Surgery Education at the [facility].    Surgery date: 12/25/2022 Surgery type: RYGB Start weight at [facility]: 294.8 Weight today: 311.7  Samples given per MNT protocol. Patient educated on appropriate usage: procare Multivitamin Lot # [id number] Exp:06/26   Procare Calcium  Lot # [id number] Exp: 06/04/2023  The following the learning objectives were met by the patient during this course: Identify Pre-Op Dietary Goals and will begin 2 weeks pre-operatively Identify appropriate sources of fluids and proteins  State protein recommendations and appropriate sources pre and post-operatively Identify Post-Operative Dietary Goals and will follow for 2 weeks post-operatively Identify appropriate multivitamin and calcium sources Describe the need for physical activity post-operatively and will follow MD recommendations State when to call healthcare provider regarding medication questions or post-operative complications When having a diagnosis of diabetes understanding hypoglycemia symptoms and the inclusion of 1 complex carbohydrate per meal  Handouts given during class include: Pre-Op Bariatric Surgery Diet Handout Protein Shake Handout Post-Op Bariatric Surgery Nutrition Handout BELT Program Information Flyer Support Group Information Flyer [facility] Bariatric Supplements Price List  Follow-Up Plan: Patient will follow-up at [facility] 2 weeks post operatively for diet advancement per MD.

## 2022-12-11 NOTE — Progress Notes (Signed)
COVID Vaccine Completed:  Date of COVID positive in last 90 days:  PCP - Lovette Cliche, PA-C Cardiologist -   Chest x-ray - 10-05-22 Epic EKG - 10-05-22 Epic Stress Test -  ECHO -  Cardiac Cath -  Pacemaker/ICD device last checked: Spinal Cord Stimulator:  Bowel Prep -   Sleep Study -  CPAP -   Fasting Blood Sugar -  Checks Blood Sugar _____ times a day  Last dose of GLP1 agonist-  N/A GLP1 instructions:  N/A   Last dose of SGLT-2 inhibitors-  N/A SGLT-2 instructions: N/A   Blood Thinner Instructions:  Time Aspirin Instructions: Last Dose:  Activity level:  Can go up a flight of stairs and perform activities of daily living without stopping and without symptoms of chest pain or shortness of breath.  Able to exercise without symptoms  Unable to go up a flight of stairs without symptoms of     Anesthesia review:   Patient denies shortness of breath, fever, cough and chest pain at PAT appointment  Patient verbalized understanding of instructions that were given to them at the PAT appointment. Patient was also instructed that they will need to review over the PAT instructions again at home before surgery.

## 2022-12-11 NOTE — Patient Instructions (Signed)
SURGICAL WAITING ROOM VISITATION Patients having surgery or a procedure may have no more than 2 support people in the waiting area - these visitors may rotate.    Children under the age of 50 must have an adult with them who is not the patient.  If the patient needs to stay at the hospital during part of their recovery, the visitor guidelines for inpatient rooms apply. Pre-op nurse will coordinate an appropriate time for 1 support person to accompany patient in pre-op.  This support person may not rotate.    Please refer to the [facility] website for the visitor guidelines for Inpatients (after your surgery is over and you are in a regular room).       Your procedure is scheduled on: 12-25-22   Report to [facility] Main Entrance    Report to admitting at 11:15 AM   Call this number if you have problems the morning of surgery [phone]   Do not eat food :After 6:00 PM the night before surgery    After Midnight you may have the following liquids until 10:30 AM DAY OF SURGERY  Water Non-Citrus Juices (without pulp, NO RED-Apple, White grape, White cranberry) Black Coffee (NO MILK/CREAM OR CREAMERS, sugar ok)  Clear Tea (NO MILK/CREAM OR CREAMERS, sugar ok) regular and decaf                             Plain Jell-O (NO RED)                                           Fruit ices (not with fruit pulp, NO RED)                                     Popsicles (NO RED)                                                               Sports drinks like Gatorade (NO RED)                       If you have questions, please contact your surgeon's office.   FOLLOW  ANY ADDITIONAL PRE OP INSTRUCTIONS YOU RECEIVED FROM YOUR SURGEON'S OFFICE!!!     Oral Hygiene is also important to reduce your risk of infection.                                    Remember - BRUSH YOUR TEETH THE MORNING OF SURGERY WITH YOUR REGULAR TOOTHPASTE   Do NOT smoke after Midnight   Take these medicines the  morning of surgery with A SIP OF WATER:  None                              You may not have any metal on your body including hair pins, jewelry, and body piercing  Do not wear make-up, lotions, powders, perfumes or deodorant  Do not wear nail polish including gel and S&S, artificial/acrylic nails, or any other type of covering on natural nails including finger and toenails. If you have artificial nails, gel coating, etc. that needs to be removed by a nail salon please have this removed prior to surgery or surgery may need to be canceled/ delayed if the surgeon/ anesthesia feels like they are unable to be safely monitored.   Do not shave  48 hours prior to surgery.    Do not bring valuables to the hospital. [facility] IS NOT RESPONSIBLE   FOR VALUABLES.   Contacts, dentures or bridgework may not be worn into surgery.   Bring small overnight bag day of surgery.   DO NOT BRING YOUR HOME MEDICATIONS TO THE HOSPITAL. PHARMACY WILL DISPENSE MEDICATIONS LISTED ON YOUR MEDICATION LIST TO YOU DURING YOUR ADMISSION IN THE HOSPITAL!              Please read over the following fact sheets you were given: IF YOU HAVE QUESTIONS ABOUT YOUR PRE-OP INSTRUCTIONS PLEASE CALL [phone] [facility]  If you received a COVID test during your pre-op visit  it is requested that you wear a mask when out in public, stay away from anyone that may not be feeling well and notify your surgeon if you develop symptoms. If you test positive for Covid or have been in contact with anyone that has tested positive in the last 10 days please notify you surgeon.  [facility] - Preparing for Surgery Before surgery, you can play an important role.  Because skin is not sterile, your skin needs to be as free of germs as possible.  You can reduce the number of germs on your skin by washing with CHG (chlorahexidine gluconate) soap before surgery.  CHG is an antiseptic cleaner which kills germs and bonds with the skin to continue  killing germs even after washing. Please DO NOT use if you have an allergy to CHG or antibacterial soaps.  If your skin becomes reddened/irritated stop using the CHG and inform your nurse when you arrive at Short Stay. Do not shave (including legs and underarms) for at least 48 hours prior to the first CHG shower.  You may shave your face/neck.  Please follow these instructions carefully:  1.  Shower with CHG Soap the night before surgery and the  morning of surgery.  2.  If you choose to wash your hair, wash your hair first as usual with your normal  shampoo.  3.  After you shampoo, rinse your hair and body thoroughly to remove the shampoo.                             4.  Use CHG as you would any other liquid soap.  You can apply chg directly to the skin and wash.  Gently with a scrungie or clean washcloth.  5.  Apply the CHG Soap to your body ONLY FROM THE NECK DOWN.   Do   not use on face/ open                           Wound or open sores. Avoid contact with eyes, ears mouth and   genitals (private parts).  Wash face,  Genitals (private parts) with your normal soap.             6.  Wash thoroughly, paying special attention to the area where your    surgery  will be performed.  7.  Thoroughly rinse your body with warm water from the neck down.  8.  DO NOT shower/wash with your normal soap after using and rinsing off the CHG Soap.                9.  Pat yourself dry with a clean towel.            10.  Wear clean pajamas.            11.  Place clean sheets on your bed the night of your first shower and do not  sleep with pets. Day of Surgery : Do not apply any lotions/deodorants the morning of surgery.  Please wear clean clothes to the hospital/surgery center.  FAILURE TO FOLLOW THESE INSTRUCTIONS MAY RESULT IN THE CANCELLATION OF YOUR SURGERY  PATIENT SIGNATURE_________________________________  NURSE  SIGNATURE__________________________________  ________________________________________________________________________    Kathleen Crawford  An incentive spirometer is a tool that can help keep your lungs clear and active. This tool measures how well you are filling your lungs with each breath. Taking long deep breaths may help reverse or decrease the chance of developing breathing (pulmonary) problems (especially infection) following: A long period of time when you are unable to move or be active. BEFORE THE PROCEDURE  If the spirometer includes an indicator to show your best effort, your nurse or respiratory therapist will set it to a desired goal. If possible, sit up straight or lean slightly forward. Try not to slouch. Hold the incentive spirometer in an upright position. INSTRUCTIONS FOR USE  Sit on the edge of your bed if possible, or sit up as far as you can in bed or on a chair. Hold the incentive spirometer in an upright position. Breathe out normally. Place the mouthpiece in your mouth and seal your lips tightly around it. Breathe in slowly and as deeply as possible, raising the piston or the ball toward the top of the column. Hold your breath for 3-5 seconds or for as long as possible. Allow the piston or ball to fall to the bottom of the column. Remove the mouthpiece from your mouth and breathe out normally. Rest for a few seconds and repeat Steps 1 through 7 at least 10 times every 1-2 hours when you are awake. Take your time and take a few normal breaths between deep breaths. The spirometer may include an indicator to show your best effort. Use the indicator as a goal to work toward during each repetition. After each set of 10 deep breaths, practice coughing to be sure your lungs are clear. If you have an incision (the cut made at the time of surgery), support your incision when coughing by placing a pillow or rolled up towels firmly against it. Once you are able to get out of  bed, walk around indoors and cough well. You may stop using the incentive spirometer when instructed by your caregiver.  RISKS AND COMPLICATIONS Take your time so you do not get dizzy or light-headed. If you are in pain, you may need to take or ask for pain medication before doing incentive spirometry. It is harder to take a deep breath if you are having pain. AFTER USE Rest and breathe slowly and easily. It can be  helpful to keep track of a log of your progress. Your caregiver can provide you with a simple table to help with this. If you are using the spirometer at home, follow these instructions: SEEK MEDICAL CARE IF:  You are having difficultly using the spirometer. You have trouble using the spirometer as often as instructed. Your pain medication is not giving enough relief while using the spirometer. You develop fever of 100.5 F (38.1 C) or higher. SEEK IMMEDIATE MEDICAL CARE IF:  You cough up bloody sputum that had not been present before. You develop fever of 102 F (38.9 C) or greater. You develop worsening pain at or near the incision site. MAKE SURE YOU:  Understand these instructions. Will watch your condition. Will get help right away if you are not doing well or get worse. Document Released: 10/09/2006 Document Revised: 08/21/2011 Document Reviewed: 12/10/2006 Exit[employer] Patient Information 2014 Exit[employer], [place].   ________________________________________________________________________ WHAT IS A BLOOD TRANSFUSION? Blood Transfusion Information  A transfusion is the replacement of blood or some of its parts. Blood is made up of multiple cells which provide different functions. Red blood cells carry oxygen and are used for blood loss replacement. White blood cells fight against infection. Platelets control bleeding. Plasma helps clot blood. Other blood products are available for specialized needs, such as hemophilia or other clotting disorders. BEFORE THE TRANSFUSION   Who gives blood for transfusions?  Healthy volunteers who are fully evaluated to make sure their blood is safe. This is blood bank blood. Transfusion therapy is the safest it has ever been in the practice of medicine. Before blood is taken from a donor, a complete history is taken to make sure that person has no history of diseases nor engages in risky social behavior (examples are intravenous drug use or sexual activity with multiple partners). The donor's travel history is screened to minimize risk of transmitting infections, such as malaria. The donated blood is tested for signs of infectious diseases, such as HIV and hepatitis. The blood is then tested to be sure it is compatible with you in order to minimize the chance of a transfusion reaction. If you or a relative donates blood, this is often done in anticipation of surgery and is not appropriate for emergency situations. It takes many days to process the donated blood. RISKS AND COMPLICATIONS Although transfusion therapy is very safe and saves many lives, the main dangers of transfusion include:  Getting an infectious disease. Developing a transfusion reaction. This is an allergic reaction to something in the blood you were given. Every precaution is taken to prevent this. The decision to have a blood transfusion has been considered carefully by your caregiver before blood is given. Blood is not given unless the benefits outweigh the risks. AFTER THE TRANSFUSION Right after receiving a blood transfusion, you will usually feel much better and more energetic. This is especially true if your red blood cells have gotten low (anemic). The transfusion raises the level of the red blood cells which carry oxygen, and this usually causes an energy increase. The nurse administering the transfusion will monitor you carefully for complications. HOME CARE INSTRUCTIONS  No special instructions are needed after a transfusion. You may find your energy is  better. Speak with your caregiver about any limitations on activity for underlying diseases you may have. SEEK MEDICAL CARE IF:  Your condition is not improving after your transfusion. You develop redness or irritation at the intravenous (IV) site. SEEK IMMEDIATE MEDICAL CARE IF:  Any of  the following symptoms occur over the next 12 hours: Shaking chills. You have a temperature by mouth above 102 F (38.9 C), not controlled by medicine. Chest, back, or muscle pain. People around you feel you are not acting correctly or are confused. Shortness of breath or difficulty breathing. Dizziness and fainting. You get a rash or develop hives. You have a decrease in urine output. Your urine turns a dark color or changes to pink, red, or brown. Any of the following symptoms occur over the next 10 days: You have a temperature by mouth above 102 F (38.9 C), not controlled by medicine. Shortness of breath. Weakness after normal activity. The white part of the eye turns yellow (jaundice). You have a decrease in the amount of urine or are urinating less often. Your urine turns a dark color or changes to pink, red, or brown. Document Released: 05/26/2000 Document Revised: 08/21/2011 Document Reviewed: 01/13/2008 [facility] Patient Information 2014 [place], [place].  _______________________________________________________________________

## 2022-12-12 ENCOUNTER — Other Ambulatory Visit: Payer: Self-pay

## 2022-12-12 ENCOUNTER — Encounter (HOSPITAL_COMMUNITY)
Admission: RE | Admit: 2022-12-12 | Discharge: 2022-12-12 | Disposition: A | Discharge status: Home / Self Care | Payer: BC Managed Care – PPO | Source: Ambulatory Visit | Attending: Surgery | Admitting: Surgery

## 2022-12-12 ENCOUNTER — Encounter (HOSPITAL_COMMUNITY): Payer: Self-pay | Admitting: *Deleted

## 2022-12-12 DIAGNOSIS — Z01812 Encounter for preprocedural laboratory examination: Secondary | ICD-10-CM | POA: Insufficient documentation

## 2022-12-12 DIAGNOSIS — Z01818 Encounter for other preprocedural examination: Secondary | ICD-10-CM

## 2022-12-12 HISTORY — DX: Prediabetes: R73.03

## 2022-12-12 LAB — TYPE AND SCREEN
ABO/RH(D): A POS
Antibody Screen: NEGATIVE

## 2022-12-25 ENCOUNTER — Ambulatory Visit (HOSPITAL_COMMUNITY): Payer: BC Managed Care – PPO | Admitting: Anesthesiology

## 2022-12-25 ENCOUNTER — Other Ambulatory Visit: Payer: Self-pay

## 2022-12-25 ENCOUNTER — Encounter (HOSPITAL_COMMUNITY)
Admission: RE | Disposition: A | Discharge status: Home / Self Care | Payer: Self-pay | Source: Ambulatory Visit | Attending: Surgery

## 2022-12-25 ENCOUNTER — Encounter (HOSPITAL_COMMUNITY): Payer: Self-pay | Admitting: Surgery

## 2022-12-25 ENCOUNTER — Observation Stay (HOSPITAL_COMMUNITY)
Admission: RE | Admit: 2022-12-25 | Discharge: 2022-12-26 | Disposition: A | Discharge status: Home / Self Care | Payer: BC Managed Care – PPO | Source: Ambulatory Visit | Attending: Surgery | Admitting: Surgery

## 2022-12-25 DIAGNOSIS — Z6841 Body Mass Index (BMI) 40.0 and over, adult: Secondary | ICD-10-CM | POA: Insufficient documentation

## 2022-12-25 DIAGNOSIS — Z87891 Personal history of nicotine dependence: Secondary | ICD-10-CM | POA: Diagnosis not present

## 2022-12-25 DIAGNOSIS — Z01818 Encounter for other preprocedural examination: Secondary | ICD-10-CM

## 2022-12-25 LAB — COMPREHENSIVE METABOLIC PANEL
ALT: 25 U/L (ref 0–44)
AST: 22 U/L (ref 15–41)
Albumin: 4.4 g/dL (ref 3.5–5.0)
Alkaline Phosphatase: 52 U/L (ref 38–126)
Anion gap: 10 (ref 5–15)
BUN: 12 mg/dL (ref 6–20)
CO2: 21 mmol/L — ABNORMAL LOW (ref 22–32)
Calcium: 9 mg/dL (ref 8.9–10.3)
Chloride: 106 mmol/L (ref 98–111)
Creatinine, Ser: 0.66 mg/dL (ref 0.44–1.00)
GFR, Estimated: >60 mL/min (ref >60)
Glucose, Bld: 89 mg/dL (ref 70–99)
Potassium: 3.8 mmol/L (ref 3.5–5.1)
Sodium: 137 mmol/L (ref 135–145)
Total Bilirubin: 0.7 mg/dL (ref 0.3–1.2)
Total Protein: 7.2 g/dL (ref 6.5–8.1)

## 2022-12-25 LAB — CBC WITH DIFFERENTIAL/PLATELET
Abs Immature Granulocytes: 0.01 10*3/uL (ref 0.00–0.07)
Basophils Absolute: 0 10*3/uL (ref 0.0–0.1)
Basophils Relative: 0 %
Eosinophils Absolute: 0.1 10*3/uL (ref 0.0–0.5)
Eosinophils Relative: 1 %
HCT: 41.7 % (ref 36.0–46.0)
Hemoglobin: 14.4 g/dL (ref 12.0–15.0)
Immature Granulocytes: 0 %
Lymphocytes Relative: 36 %
Lymphs Abs: 2.1 10*3/uL (ref 0.7–4.0)
MCH: 30.2 pg (ref 26.0–34.0)
MCHC: 34.5 g/dL (ref 30.0–36.0)
MCV: 87.4 fL (ref 80.0–100.0)
Monocytes Absolute: 0.6 10*3/uL (ref 0.1–1.0)
Monocytes Relative: 10 %
Neutro Abs: 3.1 10*3/uL (ref 1.7–7.7)
Neutrophils Relative %: 53 %
Platelets: 265 10*3/uL (ref 150–400)
RBC: 4.77 MIL/uL (ref 3.87–5.11)
RDW: 12.2 % (ref 11.5–15.5)
WBC: 5.9 10*3/uL (ref 4.0–10.5)
nRBC: 0 % (ref 0.0–0.2)

## 2022-12-25 LAB — HEMOGLOBIN AND HEMATOCRIT, BLOOD
HCT: 42.6 % (ref 36.0–46.0)
Hemoglobin: 14.3 g/dL (ref 12.0–15.0)

## 2022-12-25 LAB — ABO/RH: ABO/RH(D): A POS

## 2022-12-25 LAB — POCT PREGNANCY, URINE: Preg Test, Ur: NEGATIVE

## 2022-12-25 SURGERY — CREATION, GASTRIC BYPASS, ROUX-EN-Y, ROBOT-ASSISTED
Anesthesia: General

## 2022-12-25 MED ORDER — MORPHINE SULFATE (PF) 2 MG/ML IV SOLN: 1.0000 mg | INTRAVENOUS | Status: DC | PRN

## 2022-12-25 MED ORDER — DEXAMETHASONE SODIUM PHOSPHATE 10 MG/ML IJ SOLN
INTRAMUSCULAR | Status: DC | PRN
  Administered 2022-12-25: 10 mg via INTRAVENOUS

## 2022-12-25 MED ORDER — ONDANSETRON HCL 4 MG/2ML IJ SOLN
INTRAMUSCULAR | Status: DC | PRN
  Administered 2022-12-25: 4 mg via INTRAVENOUS

## 2022-12-25 MED ORDER — BUPIVACAINE HCL (PF) 0.25 % IJ SOLN
INTRAMUSCULAR | Status: DC | PRN
  Administered 2022-12-25: 30 mL

## 2022-12-25 MED ORDER — MIDAZOLAM HCL 5 MG/5ML IJ SOLN
INTRAMUSCULAR | Status: DC | PRN
  Administered 2022-12-25: 2 mg via INTRAVENOUS

## 2022-12-25 MED ORDER — SIMETHICONE 80 MG PO CHEW
80.0000 mg | CHEWABLE_TABLET | Freq: Four times a day (QID) | ORAL | Status: DC | PRN
  Administered 2022-12-25 – 2022-12-26 (×3): 80 mg via ORAL
  Filled 2022-12-25 (×3): qty 1

## 2022-12-25 MED ORDER — KETAMINE HCL 50 MG/5ML IJ SOSY
PREFILLED_SYRINGE | INTRAMUSCULAR | Status: AC
  Filled 2022-12-25: qty 5

## 2022-12-25 MED ORDER — ACETAMINOPHEN 500 MG PO TABS
1000.0000 mg | ORAL_TABLET | ORAL | Status: AC
  Administered 2022-12-25: 1000 mg via ORAL
  Filled 2022-12-25: qty 2

## 2022-12-25 MED ORDER — FENTANYL CITRATE PF 50 MCG/ML IJ SOSY
PREFILLED_SYRINGE | INTRAMUSCULAR | Status: AC
  Administered 2022-12-25: 50 ug via INTRAVENOUS
  Filled 2022-12-25: qty 2

## 2022-12-25 MED ORDER — PANTOPRAZOLE SODIUM 40 MG IV SOLR
40.0000 mg | Freq: Every day | INTRAVENOUS | Status: DC
  Administered 2022-12-25: 40 mg via INTRAVENOUS
  Filled 2022-12-25: qty 10

## 2022-12-25 MED ORDER — BUPIVACAINE LIPOSOME 1.3 % IJ SUSP
INTRAMUSCULAR | Status: DC | PRN
  Administered 2022-12-25: 20 mL

## 2022-12-25 MED ORDER — HEPARIN SODIUM (PORCINE) 5000 UNIT/ML IJ SOLN
5000.0000 [IU] | Freq: Three times a day (TID) | INTRAMUSCULAR | Status: DC
  Administered 2022-12-25 – 2022-12-26 (×3): 5000 [IU] via SUBCUTANEOUS
  Filled 2022-12-25 (×3): qty 1

## 2022-12-25 MED ORDER — LACTATED RINGERS IV SOLN: INTRAVENOUS | Status: DC

## 2022-12-25 MED ORDER — LIDOCAINE 2% (20 MG/ML) 5 ML SYRINGE
INTRAMUSCULAR | Status: DC | PRN
  Administered 2022-12-25: 80 mg via INTRAVENOUS
  Administered 2022-12-25: 1.5 mg/kg/h via INTRAVENOUS

## 2022-12-25 MED ORDER — LIDOCAINE HCL (PF) 2 % IJ SOLN
INTRAMUSCULAR | Status: AC
  Filled 2022-12-25: qty 15

## 2022-12-25 MED ORDER — FENTANYL CITRATE (PF) 250 MCG/5ML IJ SOLN
INTRAMUSCULAR | Status: AC
  Filled 2022-12-25: qty 5

## 2022-12-25 MED ORDER — FENTANYL CITRATE (PF) 100 MCG/2ML IJ SOLN
INTRAMUSCULAR | Status: DC | PRN
  Administered 2022-12-25: 50 ug via INTRAVENOUS
  Administered 2022-12-25: 100 ug via INTRAVENOUS

## 2022-12-25 MED ORDER — CHLORHEXIDINE GLUCONATE CLOTH 2 % EX PADS: 6.0000 | MEDICATED_PAD | Freq: Once | CUTANEOUS | Status: DC

## 2022-12-25 MED ORDER — KETAMINE HCL 10 MG/ML IJ SOLN
INTRAMUSCULAR | Status: DC | PRN
  Administered 2022-12-25: 30 mg via INTRAVENOUS

## 2022-12-25 MED ORDER — BUPIVACAINE HCL 0.25 % IJ SOLN
INTRAMUSCULAR | Status: AC
  Filled 2022-12-25: qty 1

## 2022-12-25 MED ORDER — ACETAMINOPHEN 160 MG/5ML PO SOLN: 1000.0000 mg | Freq: Three times a day (TID) | ORAL | Status: DC

## 2022-12-25 MED ORDER — SODIUM CHLORIDE 0.9 % IV SOLN
2.0000 g | INTRAVENOUS | Status: AC
  Administered 2022-12-25: 2 g via INTRAVENOUS
  Filled 2022-12-25: qty 2

## 2022-12-25 MED ORDER — CHLORHEXIDINE GLUCONATE 0.12 % MT SOLN
15.0000 mL | Freq: Once | OROMUCOSAL | Status: AC
  Administered 2022-12-25: 15 mL via OROMUCOSAL

## 2022-12-25 MED ORDER — FENTANYL CITRATE PF 50 MCG/ML IJ SOSY: 25.0000 ug | PREFILLED_SYRINGE | INTRAMUSCULAR | Status: DC | PRN

## 2022-12-25 MED ORDER — HEPARIN SODIUM (PORCINE) 5000 UNIT/ML IJ SOLN
5000.0000 [IU] | INTRAMUSCULAR | Status: AC
  Administered 2022-12-25: 5000 [IU] via SUBCUTANEOUS
  Filled 2022-12-25: qty 1

## 2022-12-25 MED ORDER — OXYCODONE HCL 5 MG/5ML PO SOLN
ORAL | Status: AC
  Filled 2022-12-25: qty 5

## 2022-12-25 MED ORDER — SUGAMMADEX SODIUM 200 MG/2ML IV SOLN
INTRAVENOUS | Status: DC | PRN
  Administered 2022-12-25: 300 mg via INTRAVENOUS

## 2022-12-25 MED ORDER — STERILE WATER FOR IRRIGATION IR SOLN
Status: DC | PRN
  Administered 2022-12-25: 1000 mL

## 2022-12-25 MED ORDER — 0.9 % SODIUM CHLORIDE (POUR BTL) OPTIME
TOPICAL | Status: DC | PRN
  Administered 2022-12-25: 1000 mL

## 2022-12-25 MED ORDER — ROCURONIUM BROMIDE 10 MG/ML (PF) SYRINGE
PREFILLED_SYRINGE | INTRAVENOUS | Status: DC | PRN
  Administered 2022-12-25: 60 mg via INTRAVENOUS
  Administered 2022-12-25: 40 mg via INTRAVENOUS
  Administered 2022-12-25 (×2): 30 mg via INTRAVENOUS

## 2022-12-25 MED ORDER — OXYCODONE HCL 5 MG PO TABS: 5.0000 mg | ORAL_TABLET | Freq: Once | ORAL | Status: AC | PRN

## 2022-12-25 MED ORDER — PROMETHAZINE HCL 25 MG/ML IJ SOLN: 6.2500 mg | INTRAMUSCULAR | Status: DC | PRN

## 2022-12-25 MED ORDER — ORAL CARE MOUTH RINSE: 15.0000 mL | Freq: Once | OROMUCOSAL | Status: AC

## 2022-12-25 MED ORDER — LACTATED RINGERS IR SOLN
Status: DC | PRN
  Administered 2022-12-25: 1000 mL

## 2022-12-25 MED ORDER — APREPITANT 40 MG PO CAPS
40.0000 mg | ORAL_CAPSULE | ORAL | Status: AC
  Administered 2022-12-25: 40 mg via ORAL
  Filled 2022-12-25: qty 1

## 2022-12-25 MED ORDER — ROCURONIUM BROMIDE 10 MG/ML (PF) SYRINGE
PREFILLED_SYRINGE | INTRAVENOUS | Status: AC
  Filled 2022-12-25: qty 20

## 2022-12-25 MED ORDER — BUPIVACAINE LIPOSOME 1.3 % IJ SUSP: 20.0000 mL | Freq: Once | INTRAMUSCULAR | Status: DC

## 2022-12-25 MED ORDER — ONDANSETRON HCL 4 MG/2ML IJ SOLN: 4.0000 mg | INTRAMUSCULAR | Status: DC | PRN

## 2022-12-25 MED ORDER — ENSURE MAX PROTEIN PO LIQD
2.0000 [oz_av] | ORAL | Status: DC
  Administered 2022-12-26 (×4): 2 [oz_av] via ORAL

## 2022-12-25 MED ORDER — GABAPENTIN 300 MG PO CAPS
300.0000 mg | ORAL_CAPSULE | ORAL | Status: AC
  Administered 2022-12-25: 300 mg via ORAL
  Filled 2022-12-25: qty 1

## 2022-12-25 MED ORDER — ACETAMINOPHEN 500 MG PO TABS
1000.0000 mg | ORAL_TABLET | Freq: Three times a day (TID) | ORAL | Status: DC
  Administered 2022-12-25 – 2022-12-26 (×3): 1000 mg via ORAL
  Filled 2022-12-25 (×3): qty 2

## 2022-12-25 MED ORDER — MIDAZOLAM HCL 2 MG/2ML IJ SOLN
INTRAMUSCULAR | Status: AC
  Filled 2022-12-25: qty 2

## 2022-12-25 MED ORDER — PROPOFOL 10 MG/ML IV BOLUS
INTRAVENOUS | Status: DC | PRN
Start: 2022-12-25 — End: 2022-12-25
  Administered 2022-12-25: 200 mg via INTRAVENOUS

## 2022-12-25 MED ORDER — OXYCODONE HCL 5 MG/5ML PO SOLN
5.0000 mg | Freq: Four times a day (QID) | ORAL | Status: DC | PRN
  Administered 2022-12-26: 5 mg via ORAL
  Filled 2022-12-25: qty 5

## 2022-12-25 MED ORDER — OXYCODONE HCL 5 MG/5ML PO SOLN
5.0000 mg | Freq: Once | ORAL | Status: AC | PRN
  Administered 2022-12-25: 5 mg via ORAL

## 2022-12-25 MED ORDER — SCOPOLAMINE 1 MG/3DAYS TD PT72
1.0000 | MEDICATED_PATCH | TRANSDERMAL | Status: DC
  Administered 2022-12-25: 1.5 mg via TRANSDERMAL
  Filled 2022-12-25: qty 1

## 2022-12-25 SURGICAL SUPPLY — 75 items
ADH SKN CLS APL DERMABOND .7 (GAUZE/BANDAGES/DRESSINGS) ×1
ANTIFOG SOL W/FOAM PAD STRL (MISCELLANEOUS) ×1
APL PRP STRL LF DISP 70% ISPRP (MISCELLANEOUS) ×2
APPLIER CLIP 5 13 M/L LIGAMAX5 (MISCELLANEOUS)
APPLIER CLIP ROT 10 11.4 M/L (STAPLE)
APR CLP MED LRG 11.4X10 (STAPLE)
APR CLP MED LRG 5 ANG JAW (MISCELLANEOUS)
BLADE SURG SZ11 CARB STEEL (BLADE) ×1 IMPLANT
CANNULA REDUCER 12-8 DVNC XI (CANNULA) ×1 IMPLANT
CHLORAPREP W/TINT 26 (MISCELLANEOUS) ×2 IMPLANT
CLIP APPLIE 5 13 M/L LIGAMAX5 (MISCELLANEOUS) IMPLANT
CLIP APPLIE ROT 10 11.4 M/L (STAPLE) IMPLANT
COVER SURGICAL LIGHT HANDLE (MISCELLANEOUS) ×1 IMPLANT
COVER TIP SHEARS 8 DVNC (MISCELLANEOUS) ×1 IMPLANT
DERMABOND ADVANCED .7 DNX12 (GAUZE/BANDAGES/DRESSINGS) ×1 IMPLANT
DRAPE ARM DVNC X/XI (DISPOSABLE) ×4 IMPLANT
DRAPE COLUMN DVNC XI (DISPOSABLE) ×1 IMPLANT
DRIVER NDL LRG 8 DVNC XI (INSTRUMENTS) ×1 IMPLANT
DRIVER NDL MEGA SUTCUT DVNCXI (INSTRUMENTS) ×1 IMPLANT
DRIVER NDLE LRG 8 DVNC XI (INSTRUMENTS) ×1 IMPLANT
DRIVER NDLE MEGA SUTCUT DVNCXI (INSTRUMENTS) ×1 IMPLANT
ELECT REM PT RETURN 15FT ADLT (MISCELLANEOUS) ×1 IMPLANT
FORCEPS CADIERE DVNC XI (FORCEP) ×1 IMPLANT
GAUZE 4X4 16PLY ~~LOC~~+RFID DBL (SPONGE) ×1 IMPLANT
GLOVE BIO SURGEON STRL SZ7.5 (GLOVE) ×2 IMPLANT
GLOVE INDICATOR 8.0 STRL GRN (GLOVE) ×2 IMPLANT
GOWN STRL REUS W/ TWL XL LVL3 (GOWN DISPOSABLE) ×3 IMPLANT
GOWN STRL REUS W/TWL XL LVL3 (GOWN DISPOSABLE) ×3
GRASPER SUT TROCAR 14GX15 (MISCELLANEOUS) ×1 IMPLANT
GRASPER TIP-UP FEN DVNC XI (INSTRUMENTS) ×1 IMPLANT
IRRIG SUCT STRYKERFLOW 2 WTIP (MISCELLANEOUS)
IRRIGATION SUCT STRKRFLW 2 WTP (MISCELLANEOUS) IMPLANT
IRRIGATOR SUCT 8 DISP DVNC XI (IRRIGATION / IRRIGATOR) IMPLANT
KIT BASIN OR (CUSTOM PROCEDURE TRAY) ×1 IMPLANT
KIT GASTRIC LAVAGE 34FR ADT (SET/KITS/TRAYS/PACK) ×1 IMPLANT
KIT TURNOVER KIT A (KITS) IMPLANT
LUBRICANT JELLY K Y 4OZ (MISCELLANEOUS) IMPLANT
MARKER SKIN DUAL TIP RULER LAB (MISCELLANEOUS) ×1 IMPLANT
MAT PREVALON FULL STRYKER (MISCELLANEOUS) ×1 IMPLANT
NDL SPNL 18GX3.5 QUINCKE PK (NEEDLE) ×1 IMPLANT
NEEDLE SPNL 18GX3.5 QUINCKE PK (NEEDLE) ×1 IMPLANT
OBTURATOR OPTICAL STND 8 DVNC (TROCAR) ×1
OBTURATOR OPTICALSTD 8 DVNC (TROCAR) ×1 IMPLANT
PACK CARDIOVASCULAR III (CUSTOM PROCEDURE TRAY) ×1 IMPLANT
RELOAD STAPLE 60 2.5 WHT DVNC (STAPLE) ×3 IMPLANT
RELOAD STAPLE 60 3.5 BLU DVNC (STAPLE) IMPLANT
RELOAD STAPLER 2.5X60 WHT DVNC (STAPLE) ×8 IMPLANT
RELOAD STAPLER 3.5X60 BLU DVNC (STAPLE) IMPLANT
SCISSORS MNPLR CVD DVNC XI (INSTRUMENTS) ×1 IMPLANT
SEAL UNIV 5-12 XI (MISCELLANEOUS) ×4 IMPLANT
SEALER VESSEL EXT DVNC XI (MISCELLANEOUS) ×1 IMPLANT
SET TUBE SMOKE EVAC HIGH FLOW (TUBING) ×1 IMPLANT
SOL ELECTROSURG ANTI STICK (MISCELLANEOUS) ×1
SOLUTION ANTFG W/FOAM PAD STRL (MISCELLANEOUS) ×1 IMPLANT
SOLUTION ELECTROSURG ANTI STCK (MISCELLANEOUS) ×1 IMPLANT
SPIKE FLUID TRANSFER (MISCELLANEOUS) ×1 IMPLANT
STAPLER 60 SUREFORM DVNC (STAPLE) ×1 IMPLANT
STAPLER RELOAD 2.5X60 WHT DVNC (STAPLE) ×8
STAPLER RELOAD 3.5X60 BLU DVNC (STAPLE)
SUT MNCRL AB 4-0 PS2 18 (SUTURE) ×1 IMPLANT
SUT SILK 0 SH 30 (SUTURE) IMPLANT
SUT SILK 2 0 SH (SUTURE) ×2 IMPLANT
SUT V-LOC BARB 180 2/0GR6 GS22 (SUTURE) ×2
SUT VIC AB 2-0 SH 27 (SUTURE) ×1
SUT VIC AB 2-0 SH 27XBRD (SUTURE) IMPLANT
SUT VICRYL 0 TIES 12 18 (SUTURE) ×1 IMPLANT
SUT VLOC BARB 180 ABS3/0GR12 (SUTURE) ×2
SUTURE V-LC BRB 180 2/0GR6GS22 (SUTURE) ×2 IMPLANT
SUTURE VLOC BRB 180 ABS3/0GR12 (SUTURE) ×2 IMPLANT
SYR 20ML LL LF (SYRINGE) ×1 IMPLANT
TOWEL OR 17X26 10 PK STRL BLUE (TOWEL DISPOSABLE) ×1 IMPLANT
TRAY FOLEY MTR SLVR 16FR STAT (SET/KITS/TRAYS/PACK) IMPLANT
TROCAR ADV FIXATION 12X100MM (TROCAR) IMPLANT
TROCAR Z-THREAD FIOS 5X100MM (TROCAR) ×1 IMPLANT
TUBE CALIBRATION LAPBAND (TUBING) IMPLANT

## 2022-12-25 NOTE — Progress Notes (Addendum)
PHARMACY CONSULT FOR:  Risk Assessment for Post-Discharge VTE Following Bariatric Surgery  Post-Discharge VTE Risk Assessment: This patient's probability of 30-day post-discharge VTE is increased due to the factors marked:  Sleeve gastrectomy   Liver disorder (transplant, cirrhosis, or nonalcoholic steatohepatitis)   Hx of VTE   Hemorrhage requiring transfusion   GI perforation, leak, or obstruction   ====================================================    Female    Age >/=60 years    BMI >/=50 kg/m2    CHF    Dyspnea at Rest    Paraplegia   x Non-gastric-band surgery    Operation Time >/=3 hr    Return to OR     Length of Stay >/= 3 d   Hypercoagulable condition   Significant venous stasis      Predicted probability of 30-day post-discharge VTE:  0.16% (Mild)  Other patient-specific factors to consider:   Recommendation for Discharge: No pharmacologic prophylaxis post-discharge    Kathleen Crawford is a 27 y.o. female who underwent  ROBOTIC GASTRIC BY-PASS/RNY, UPPER GASTROINTESTINAL ENDOSCOPY on 12/25/2022   Case start: 1323 Case end: 1529  No Known Allergies  Patient Measurements: Height: 5\' 7"  (170.2 cm) Weight: 134.2 kg (295 lb 12.8 oz) IBW/kg (Calculated) : 61.6 Body mass index is 46.33 kg/m.  Recent Labs    12/25/22 1149  WBC 5.9  HGB 14.4  HCT 41.7  PLT 265  CREATININE 0.66  ALBUMIN 4.4  PROT 7.2  AST 22  ALT 25  ALKPHOS 52  BILITOT 0.7   Estimated Creatinine Clearance: 152.4 mL/min (by C-G formula based on SCr of 0.66 mg/dL).   Past Medical History:  Diagnosis Date   Pre-diabetes     No medications prior to admission.     Lynden Ang, PharmD, BCPS 12/25/2022,5:37 PM

## 2022-12-25 NOTE — Transfer of Care (Signed)
Immediate Anesthesia Transfer of Care Note  Patient: Kathleen Crawford  Procedure(s) Performed: ROBOTIC GASTRIC BY-PASS/RNY, UPPER GASTROINTESTINAL ENDOSCOPY  Patient Location: PACU  Anesthesia Type:General  Level of Consciousness: awake, alert , oriented, and patient cooperative  Airway & Oxygen Therapy: Patient Spontanous Breathing and Patient connected to face mask oxygen  Post-op Assessment: Report given to RN and Post -op Vital signs reviewed and stable  Post vital signs: Reviewed and stable  Last Vitals:  Vitals Value Taken Time  BP 141/73 12/25/22 1545  Temp    Pulse 77 12/25/22 1547  Resp 20 12/25/22 1547  SpO2 100 % 12/25/22 1547  Vitals shown include unfiled device data.  Last Pain:  Vitals:   12/25/22 1128  TempSrc: Oral         Complications: No notable events documented.

## 2022-12-25 NOTE — Anesthesia Procedure Notes (Signed)
Procedure Name: Intubation Date/Time: 12/25/2022 1:11 PM  Performed by: Ponciano Ort, CRNAPre-anesthesia Checklist: Patient identified, Emergency Drugs available, Suction available and Patient being monitored Patient Re-evaluated:Patient Re-evaluated prior to induction Oxygen Delivery Method: Circle system utilized Preoxygenation: Pre-oxygenation with 100% oxygen Induction Type: IV induction Ventilation: Mask ventilation without difficulty Laryngoscope Size: Mac and 3 Grade View: Grade I Tube type: Oral Tube size: 7.0 mm Number of attempts: 1 Airway Equipment and Method: Stylet and Oral airway Placement Confirmation: ETT inserted through vocal cords under direct vision, positive ETCO2 and breath sounds checked- equal and bilateral Secured at: 22 cm Tube secured with: Tape Dental Injury: Teeth and Oropharynx as per pre-operative assessment

## 2022-12-25 NOTE — Anesthesia Preprocedure Evaluation (Addendum)
Anesthesia Evaluation  Patient identified by MRN, date of birth, ID band Patient awake    Reviewed: Allergy & Precautions, NPO status , Patient's Chart, lab work & pertinent test results  History of Anesthesia Complications Negative for: history of anesthetic complications  Airway Mallampati: II  TM Distance: >3 FB Neck ROM: Full    Dental  (+) Dental Advisory Given, Teeth Intact   Pulmonary former smoker   Pulmonary exam normal        Cardiovascular negative cardio ROS Normal cardiovascular exam     Neuro/Psych negative neurological ROS  negative psych ROS   GI/Hepatic Neg liver ROS,GERD  ,,  Endo/Other    Morbid obesity Pre-DM   Renal/GU negative Renal ROS     Musculoskeletal negative musculoskeletal ROS (+)    Abdominal  (+) + obese  Peds  Hematology negative hematology ROS (+)   Anesthesia Other Findings   Reproductive/Obstetrics                              Anesthesia Physical Anesthesia Plan  ASA: 3  Anesthesia Plan: General   Post-op Pain Management: Tylenol PO (pre-op)*, Ketamine IV* and Lidocaine infusion*   Induction: Intravenous  PONV Risk Score and Plan: 3 and Treatment may vary due to age or medical condition, Ondansetron, Dexamethasone, Midazolam and Scopolamine patch - Pre-op  Airway Management Planned: Oral ETT  Additional Equipment: None  Intra-op Plan:   Post-operative Plan: Extubation in OR  Informed Consent: I have reviewed the patients History and Physical, chart, labs and discussed the procedure including the risks, benefits and alternatives for the proposed anesthesia with the patient or authorized representative who has indicated his/her understanding and acceptance.     Dental advisory given  Plan Discussed with: CRNA and Anesthesiologist  Anesthesia Plan Comments:         Anesthesia Quick Evaluation

## 2022-12-25 NOTE — Anesthesia Postprocedure Evaluation (Signed)
Anesthesia Post Note  Patient: Kathleen Crawford  Procedure(s) Performed: ROBOTIC GASTRIC BY-PASS/RNY, UPPER GASTROINTESTINAL ENDOSCOPY     Patient location during evaluation: PACU Anesthesia Type: General Level of consciousness: awake and alert Pain management: pain level controlled Vital Signs Assessment: post-procedure vital signs reviewed and stable Respiratory status: spontaneous breathing, nonlabored ventilation and respiratory function stable Cardiovascular status: stable and blood pressure returned to baseline Anesthetic complications: no   No notable events documented.  Last Vitals:  Vitals:   12/25/22 1621 12/25/22 1630  BP:  (!) 148/77  Pulse: 76 77  Resp: 15 12  Temp:    SpO2: 97% 94%    Last Pain:  Vitals:   12/25/22 1630  TempSrc:   PainSc: 5                  Beryle Lathe

## 2022-12-25 NOTE — H&P (Signed)
   Admitting Physician: Hyman Hopes Briley Sulton  Service: Bariatric surgery  CC: Obesity  Subjective   HPI: Kathleen Crawford is an 27 y.o. female who is here for gastric bypass  Past Medical History:  Diagnosis Date   Pre-diabetes     Past Surgical History:  Procedure Laterality Date   BIOPSY  10/06/2022   Procedure: BIOPSY;  Surgeon: Quentin Ore, MD;  Location: [facility];  Service: General;;   ESOPHAGOGASTRODUODENOSCOPY N/A 10/06/2022   Procedure: ESOPHAGOGASTRODUODENOSCOPY (EGD);  Surgeon: Quentin Ore, MD;  Location: [facility];  Service: General;  Laterality: N/A;   WISDOM TOOTH EXTRACTION      History reviewed. No pertinent family history.  Social:  reports that she has quit smoking. Her smoking use included cigarettes. She has a 1 pack-year smoking history. She has never used smokeless tobacco. She reports that she does not currently use alcohol. She reports that she does not use drugs.  Allergies: No Known Allergies  Medications: No current outpatient medications  ROS - all of the below systems have been reviewed with the patient and positives are indicated with bold text General: chills, fever or night sweats Eyes: blurry vision or double vision ENT: epistaxis or sore throat Allergy/Immunology: itchy/watery eyes or nasal congestion Hematologic/Lymphatic: bleeding problems, blood clots or swollen lymph nodes Endocrine: temperature intolerance or unexpected weight changes Breast: new or changing breast lumps or nipple discharge Resp: cough, shortness of breath, or wheezing CV: chest pain or dyspnea on exertion GI: as per HPI GU: dysuria, trouble voiding, or hematuria MSK: joint pain or joint stiffness Neuro: TIA or stroke symptoms Derm: pruritus and skin lesion changes Psych: anxiety and depression  Objective   PE Blood pressure 129/69, pulse 70, temperature 98.2 F (36.8 C), temperature source Oral, resp. rate 16, height 5\' 7"  (1.702 m),  weight 134.2 kg, last menstrual period 12/11/2022, SpO2 98%. Constitutional: NAD; conversant; no deformities Eyes: Moist conjunctiva; no lid lag; anicteric; PERRL Neck: Trachea midline; no thyromegaly Lungs: Normal respiratory effort; no tactile fremitus CV: RRR; no palpable thrills; no pitting edema GI: Abd soft, nontender; no palpable hepatosplenomegaly MSK: Normal range of motion of extremities; no clubbing/cyanosis Psychiatric: Appropriate affect; alert and oriented x3 Lymphatic: No palpable cervical or axillary lymphadenopathy  Results for orders placed or performed during the hospital encounter of 12/25/22 (from the past 24 hour(s))  Pregnancy, urine POC     Status: None   Collection Time: 12/25/22 11:32 AM  Result Value Ref Range   Preg Test, Ur NEGATIVE NEGATIVE    Imaging Orders  No imaging studies ordered today     Assessment and Plan   Kathleen Crawford is an 27 y.o. female with morbid obesity, here for gastric bypass with upper endoscopy.  She completed the bariatric pathway.  We discussed the procedure, its risks, benefits and alternatives on multiple occasions.  She voiced consent to proceed.  We will proceed as scheduled.     Quentin Ore, MD  [facility] Use [email or web address] to contact on call provider

## 2022-12-25 NOTE — Op Note (Signed)
Patient: Kathleen Crawford (05/13/1996, [id number])  Date of Surgery: 12/25/2022   Preoperative Diagnosis: MORBID OBESITY  BMI 46  Postoperative Diagnosis: MORBID OBESITY  BMI 46  Surgical Procedure: ROBOTIC GASTRIC BY-PASS/RNY, UPPER GASTROINTESTINAL ENDOSCOPY:    Operative Team Members:  Surgeons and Role:    * Araceli Coufal, Hyman Hopes, MD - Primary    * Kinsinger, De Blanch, MD - Assisting   Anesthesiologist: Beryle Lathe, MD CRNA: Ponciano Ort, CRNA; Florene Route, CRNA   Anesthesia: General   Fluids:  Total I/O In: 100 [IV Piggyback:100] Out: -   Complications: None  Drains:  none   Specimen: None  Disposition:  PACU - hemodynamically stable.  Plan of Care: Admit for overnight observation    Indications for Procedure:Kathleen Crawford is an 27 y.o. female with morbid obesity (BMI 46), here for gastric bypass with upper endoscopy.  She completed the bariatric pathway.  We discussed the procedure, its risks, benefits and alternatives on multiple occasions.  She voiced consent to proceed.  We will proceed as scheduled.   Findings: Normal anatomy  Infection status: Patient: Private Patient Elective Case Case: Elective Infection Present At Time Of Surgery (PATOS): Some spillage of foregut  and jejunal contents while creating anastomoses   Description of Procedure:   On the date stated above, the patient was taken to the operating room suite and placed in supine positioning.  General endotracheal anesthesia was induced.  A timeout was completed verifying the correct patient, procedure, positioning and equipment needed for the case.  The patient's abdomen was prepped and draped in the usual sterile fashion.  A 5 mm trocar was used to enter the right upper quadrant using optical technique.  The abdomen was entered safely without any trauma the underlying viscera.  Three additional incisions were made and 4 robotic trochars were placed across the abdomen, replacing the 5 mm  trocar with the 12 mm robotic stapler trocar.  [facility] liver retractor was placed through the subxiphoid region and under the left lobe of the liver and was connected to the rail of the bed.  A TAP block was placed using marcaine and Exparel under direct vision of the laparoscope.  The [employer] XI robotic platform was docked and we transitioned to robotic surgery.  Using the tip up grasper, fenestrated bipolar, 30 degree camera and Vessel Sealer from the patient's right to left, we began by dissecting the angle of His off the left crus of the diaphragm.  The adhesions between the stomach, spleen and diaphragm were divided using the Vessel Sealer to define the angle of His.  I then started 4-6 cm down on the lesser curve of the stomach and created a defect in the gastrohepatic ligament tracking behind the lesser curve of the stomach to enter the lesser sac.  I then used multiple white loads of the robotic 60 mm Sureform linear stapler to form the gastric pouch.  I then directed my attention to the lower abdomen.  The omentum was divided with the Vessel Sealer and I identified the ligament of treitz.  The jejunum was run to a point 50 cm from the ligament of Treitz.  This loop of bowel was then brought into the left upper quadrant, over the transverse colon, between the split omentum.  A 2-0 v-loc suture was used to create the posterior outer row of the gastrojejunal anastomosis.  An approximately 2 cm gastrotomy was made in the pouch and a matching 2 cm enterotomy was created in  the roux limb.  Then, two 3-0 v-loc sutures were used to create a posterior, inner, full thickness layer of the anastomosis.  While sewing the anterior inner layer, the Ewald tube was passed through the anastomosis to ensure patency.  The outer, anterior layer was then created using 2-0 v-loc suture.  A window was made in the jejunal mesentery and the jejunum was divided just proximal to the gastrojejunal anastomosis using a white  load of the robotic 60 mm Sureform linear stapler to divide the roux limb from the hepatobiliary limb.  At this point the gastrojejunal anastomosis was complete and the Ewald tube was removed.  The roux limb was then run 100 cm from the gastrojejunal anastomosis.  This loop of bowel was brought into the left upper quadrant for anastomosis to the hepatobiliary limb.  A robotic 60 mm white load on the Sureform linear stapler was introduced into both limbs and fired to create the jejunojejunostomy.  A second 60 mm white load of the Sureform linear stapler was fired to connect the distal Roux limb to the proximal common channel creating a W shaped anastomosis.  The common enterotomy was closed with a final 60 mm white load of the Sureform linear stapler.   I oversewed this staple line with a 2-0 vicryl suture as there was a serosal injury from retraction along the staple line. The jejunojejunostomy mesenteric defect was closed using running 2-0-Silk suture.   The retro-roux defect was closed, closing the roux limb mesentery to the transverse mesocolon mesentery using a running 2-0 silk suture.   I ran the roux limb from the gastrojejunal anastomosis to the jejunojejunostomy and found the anatomy as expected without any twisting of the mesentery.  The intra-abdominal pressure was decreased to for the remainder of the case to check for hemostasis.  I then transitioned to endoscopic portion of the procedure.  The adult upper endoscope was inserted into the pouch.  The pouch appeared appropriately sized.  There was good intra-luminal hemostasis.  The endoscope was inserted through the anastomosis into the roux limb.  The anastomosis appeared well formed without any stricture.  The anastomosis was submerged in irrigation in the left upper quadrant and there was no leakage of air bubbles with endoscopic insufflation suggesting a negative leak test and an air tight anastomosis.    The foregut was decompressed with  the endoscope and the endoscope was removed.  The robotic instruments were removed and the robot was undocked.  The stapler port in the right abdomen was closed at the fascial level using 0-vicryl on a PMI suture passer.  The liver retractor was removed under direct vision.  The pneumoperitoneum was evacuated.  The skin was closed using 4-0 Monocryl and Dermabond.  All sponge and needle counts were correct at the end of the case.    Ivar Drape, MD General, Bariatric, & Minimally Invasive Surgery [facility], [place]

## 2022-12-26 LAB — CBC WITH DIFFERENTIAL/PLATELET
Abs Immature Granulocytes: 0.03 10*3/uL (ref 0.00–0.07)
Basophils Absolute: 0 10*3/uL (ref 0.0–0.1)
Basophils Relative: 0 %
Eosinophils Absolute: 0 10*3/uL (ref 0.0–0.5)
Eosinophils Relative: 0 %
HCT: 42.6 % (ref 36.0–46.0)
Hemoglobin: 14.2 g/dL (ref 12.0–15.0)
Immature Granulocytes: 0 %
Lymphocytes Relative: 11 %
Lymphs Abs: 0.9 10*3/uL (ref 0.7–4.0)
MCH: 30 pg (ref 26.0–34.0)
MCHC: 33.3 g/dL (ref 30.0–36.0)
MCV: 90.1 fL (ref 80.0–100.0)
Monocytes Absolute: 0.5 10*3/uL (ref 0.1–1.0)
Monocytes Relative: 7 %
Neutro Abs: 6.5 10*3/uL (ref 1.7–7.7)
Neutrophils Relative %: 82 %
Platelets: 257 10*3/uL (ref 150–400)
RBC: 4.73 MIL/uL (ref 3.87–5.11)
RDW: 12.4 % (ref 11.5–15.5)
WBC: 7.9 10*3/uL (ref 4.0–10.5)
nRBC: 0 % (ref 0.0–0.2)

## 2022-12-26 MED ORDER — GABAPENTIN 100 MG PO CAPS: 200.0000 mg | ORAL_CAPSULE | Freq: Two times a day (BID) | ORAL | 0 refills | Status: AC

## 2022-12-26 MED ORDER — OXYCODONE HCL 5 MG PO TABS: 5.0000 mg | ORAL_TABLET | Freq: Four times a day (QID) | ORAL | 0 refills | Status: AC | PRN

## 2022-12-26 MED ORDER — ONDANSETRON 4 MG PO TBDP: 4.0000 mg | ORAL_TABLET | Freq: Four times a day (QID) | ORAL | 0 refills | Status: AC | PRN

## 2022-12-26 MED ORDER — PANTOPRAZOLE SODIUM 40 MG PO TBEC: 40.0000 mg | DELAYED_RELEASE_TABLET | Freq: Every day | ORAL | 0 refills | Status: AC

## 2022-12-26 MED ORDER — ACETAMINOPHEN 500 MG PO TABS: 1000.0000 mg | ORAL_TABLET | Freq: Three times a day (TID) | ORAL | Status: AC

## 2022-12-26 MED ORDER — METHOCARBAMOL 750 MG PO TABS: 750.0000 mg | ORAL_TABLET | Freq: Four times a day (QID) | ORAL | 0 refills | Status: AC | PRN

## 2022-12-26 NOTE — Discharge Summary (Signed)
  Patient ID: Kathleen Crawford [id number] 27 y.o. Mar 10, 1996  12/25/2022  Discharge date and time: 12/26/2022  Admitting Physician: Kathleen Crawford  Discharge Physician: Kathleen Crawford  Admission Diagnoses: Morbid obesity with BMI of 45.0-49.9, adult (HCC) [E66.01, Z68.42] Patient Active Problem List   Diagnosis Date Noted   Morbid obesity with BMI of 45.0-49.9, adult (HCC) 12/25/2022   Esophageal reflux 10/06/2022     Discharge Diagnoses:  Patient Active Problem List   Diagnosis Date Noted   Morbid obesity with BMI of 45.0-49.9, adult (HCC) 12/25/2022   Esophageal reflux 10/06/2022    Operations: Procedure(s): ROBOTIC GASTRIC BY-PASS/RNY, UPPER GASTROINTESTINAL ENDOSCOPY  Admission Condition: good  Discharged Condition: good  Indication for Admission: Obesity  Hospital Course: Gastric bypass  Consults: None  Significant Diagnostic Studies: None  Treatments: surgery: as above  Disposition: Home  Patient Instructions:  Allergies as of 12/26/2022   No Known Allergies      Medication List     TAKE these medications    acetaminophen 500 MG tablet Commonly known as: TYLENOL Take 2 tablets (1,000 mg total) by mouth every 8 (eight) hours for 5 days.   gabapentin 100 MG capsule Commonly known as: NEURONTIN Take 2 capsules (200 mg total) by mouth every 12 (twelve) hours.   methocarbamol 750 MG tablet Commonly known as: Robaxin-750 Take 1 tablet (750 mg total) by mouth every 6 (six) hours as needed for muscle spasms.   ondansetron 4 MG disintegrating tablet Commonly known as: ZOFRAN-ODT Take 1 tablet (4 mg total) by mouth every 6 (six) hours as needed for nausea or vomiting.   oxyCODONE 5 MG immediate release tablet Commonly known as: Oxy IR/ROXICODONE Take 1 tablet (5 mg total) by mouth every 6 (six) hours as needed for severe pain.   pantoprazole 40 MG tablet Commonly known as: PROTONIX Take 1 tablet (40 mg total) by mouth daily.         Activity: no heavy lifting for 4 weeks Diet:  Bariatric diet Wound Care: keep wound clean and dry  Follow-up:  With Dr. Dossie Der.  Signed: Hyman Hopes Ramisa Duman General, Bariatric, & Minimally Invasive Surgery [facility], [place]   12/26/2022, 1:52 PM

## 2022-12-26 NOTE — Discharge Instructions (Signed)

## 2022-12-26 NOTE — Progress Notes (Signed)
Patient and husband were provided with discharge education, patient verbalized understanding.

## 2022-12-26 NOTE — Progress Notes (Signed)
Patient alert and oriented, pain is controlled. Patient is tolerating fluids, advanced to protein shake today, patient is tolerating well. Reviewed Gastric sleeve/bypass discharge instructions with patient and patient is able to articulate understanding. Provided information on BELT program, Support Group, BSTOP-D, and [facility] outpatient pharmacy. Communicated general update of patient status to surgeon. All questions answered. 24hr fluid recall is 360 mL per hydration protocol, bariatric nurse coordinator to make follow-up phone call within one week.    Thank you,  Lubertha Basque, RN, MSN Bariatric Nurse Coordinator [phone] (office)

## 2022-12-26 NOTE — Progress Notes (Signed)
   12/26/22 1341  TOC Brief Assessment  Insurance and Status Reviewed  Patient has primary care physician Yes  Home environment has been reviewed home with spouse  Prior level of function: independent  Prior/Current Home Services No current home services  Social Determinants of Health Reivew SDOH reviewed no interventions necessary  Readmission risk has been reviewed Yes  Transition of care needs no transition of care needs at this time

## 2022-12-26 NOTE — Plan of Care (Signed)
Patient maintaining hydration.

## 2022-12-26 NOTE — Progress Notes (Signed)
Progress Note: General Surgery Service   Chief Complaint/Subjective: Gas pain into shoulder  Objective: Vital signs in last 24 hours: Temp:  [97.3 F (36.3 C)-98.2 F (36.8 C)] 98.1 F (36.7 C) (07/16 0541) Pulse Rate:  [55-89] 55 (07/16 0541) Resp:  [12-25] 17 (07/16 0541) BP: (122-148)/(63-84) 131/71 (07/16 0541) SpO2:  [94 %-100 %] 100 % (07/16 0541) Weight:  [134.2 kg] 134.2 kg (07/15 1128) Last BM Date : 12/23/22  Intake/Output from previous day: 07/15 0701 - 07/16 0700 In: 2380.5 [P.O.:360; I.V.:1920.5; IV Piggyback:100] Out: 1320 [Urine:1300; Blood:20] Intake/Output this shift: No intake/output data recorded.  GI: Abd soft, incisions c/d/I w/ glue  Lab Results: CBC  Recent Labs    12/25/22 1149 12/25/22 2137 12/26/22 0426  WBC 5.9  --  7.9  HGB 14.4 14.3 14.2  HCT 41.7 42.6 42.6  PLT 265  --  257   BMET Recent Labs    12/25/22 1149  NA 137  K 3.8  CL 106  CO2 21*  GLUCOSE 89  BUN 12  CREATININE 0.66  CALCIUM 9.0   PT/INR No results for input(s): "LABPROT", "INR" in the last 72 hours. ABG No results for input(s): "PHART", "HCO3" in the last 72 hours.  Invalid input(s): "PCO2", "PO2"  Anti-infectives: Anti-infectives (From admission, onward)    Start     Dose/Rate Route Frequency Ordered Stop   12/25/22 1130  cefoTEtan (CEFOTAN) 2 g in sodium chloride 0.9 % 100 mL IVPB        2 g 200 mL/hr over 30 Minutes Intravenous On call to O.R. 12/25/22 1115 12/25/22 1313       Medications: Scheduled Meds:  acetaminophen  1,000 mg Oral Q8H   Or   acetaminophen (TYLENOL) oral liquid 160 mg/5 mL  1,000 mg Oral Q8H   heparin injection (subcutaneous)  5,000 Units Subcutaneous Q8H   pantoprazole (PROTONIX) IV  40 mg Intravenous QHS   Ensure Max Protein  2 oz Oral Q2H   Continuous Infusions:  lactated ringers 75 mL/hr at 12/25/22 1847   PRN Meds:.morphine injection, ondansetron (ZOFRAN) IV, oxyCODONE, simethicone  Assessment/Plan: s/p  Procedure(s): ROBOTIC GASTRIC BY-PASS/RNY, UPPER GASTROINTESTINAL ENDOSCOPY 12/25/2022  Pain from insufflation Tolerating liquids    LOS: 0 days    Quentin Ore, MD  [facility] Use [email or web address] to contact on call provider  Daily Billing: [id number] - post op

## 2023-01-02 ENCOUNTER — Telehealth (HOSPITAL_COMMUNITY): Payer: Self-pay | Admitting: *Deleted

## 2023-01-02 NOTE — Telephone Encounter (Signed)
Pt called with c/o of lower right abdominal discomfort. See below for additional notes  1. Tell me about your pain and pain management?    Pt states that s/he has been experiencing some intermittent abdominal right-sided abdominal pain. Pt states that s/he has been taking tylenol.   Discussed with patient to try and splint his/her abdomen with changing positions to assist with the discomfort. Encouraged pt to try getting into a comfortable position and remaining upright, especially after eating/drinking, and/or contact [facility] if still concerned. Also reminded pt to utilize the prescribed pain medication to help with relief, if pt still not finding relief afterwards recommended for pt to contact [facility]   2. Let's talk about fluid intake. How much total fluid are you taking in?   Pt states that s/he is working to meet goal of 64 oz of fluid today. Pt encouraged to continue to work towards meeting goal. Pt instructed to assess status and suggestions daily utilizing Hydration Action Plan on discharge folder and to call [facility] if in the "red zone".    3. How much protein have you taken in the last day?     Pt states she is exceeding her goal of 60g of protein by meeting 84g of protein each day with the protein shakes and powder    4. Have you had nausea? Tell me about when you have experienced nausea and what you did to help?   Pt denies nausea.   5. Has the frequency or color changed with your urine?   Pt states that s/he is urinating "fine" with no changes in frequency or urgency.   6. Tell me what your incisions look like?   "Incisions look fine". Pt denies a fever, chills. Pt states incisions are not swollen, open, or draining. Pt encouraged to call [facility] if incisions change.      7. Have you been passing gas? BM?   Pt states that they are having BMs.   Pt instructed to take either Miralax or MoM as instructed per "Gastric Bypass/Sleeve Discharge Home Care Instructions". Pt to call surgeon's  office if not able to have BM with medication.      8. If a problem or question were to arise who would you call? Do you know contact numbers for [facility], [facility], and [facility]?   Pt knows to call [facility] for surgical, [facility] for nutrition, and [facility] for non-urgent questions or concerns. Pt denies dehydration symptoms. Pt can describe s/sx of dehydration.   9. How has the walking going?   Pt states s/he is walking around and able to be active without difficulty.   10. Are you still using your incentive spirometer? If so, how often?   Pt states that s/he is doing the I.S. Pt encouraged to use incentive spirometer, at least every hour while awake until s/he sees the surgeon.   11. How are your vitamins and calcium going? How are you taking them?     Pt states that s/he is taking his/her supplements and vitamins without difficulty.

## 2023-01-09 ENCOUNTER — Encounter: Payer: BC Managed Care – PPO | Attending: Surgery | Admitting: Skilled Nursing Facility1

## 2023-01-09 VITALS — Wt 281.0 lb

## 2023-01-09 DIAGNOSIS — E669 Obesity, unspecified: Secondary | ICD-10-CM | POA: Insufficient documentation

## 2023-01-10 ENCOUNTER — Encounter: Payer: Self-pay | Admitting: Skilled Nursing Facility1

## 2023-01-10 NOTE — Progress Notes (Signed)
2 Week Post-Operative Nutrition Class   Patient was seen on 01/09/2023 for Post-Operative Nutrition education at the [facility]    Surgery date: 12/25/2022 Surgery type: RYGB Start weight at [facility]: 294.8 Weight today: 281 Bowel Habits: Every day to every other day no complaints   Body Composition Scale 01/09/2023  Current Body Weight 281  Total Body Fat % 45  Visceral Fat 12  Fat-Free Mass % 54.9   Total Body Water % 41.9  Muscle-Mass lbs 36.1  BMI 43.7  Body Fat Displacement          Torso  lbs 78.6         Left Leg  lbs 15.7         Right Leg  lbs 15.7         Left Arm  lbs 7.8         Right Arm  lbs 7.8      The following the learning objectives were met by the patient during this course: Identifies Soft Prepped Plan Advancement Guide  Identifies Soft, High Proteins (Phase 1), beginning 2 weeks post-operatively to 3 weeks post-operatively Identifies Additional Soft High Proteins, soft non-starchy vegetables, fruits and starches (Phase 2), beginning 3 weeks post-operatively to 3 months post-operatively Identifies appropriate sources of fluids, proteins, vegetables, fruits and starches Identifies appropriate fat sources and healthy verses unhealthy fat types   States protein, vegetable, fruit and starch recommendations and appropriate sources post-operatively Identifies the need for appropriate texture modifications, mastication, and bite sizes when consuming solids Identifies appropriate fat consumption and sources Identifies appropriate multivitamin and calcium sources post-operatively Describes the need for physical activity post-operatively and will follow MD recommendations States when to call healthcare provider regarding medication questions or post-operative complications   Handouts given during class include: Soft Prepped Plan Advancement Guide   Follow-Up Plan: Patient will follow-up at [facility] in 10 weeks for 3 month post-op  nutrition visit for diet advancement per MD.

## 2023-01-18 ENCOUNTER — Telehealth: Payer: Self-pay | Admitting: Dietician

## 2023-01-18 NOTE — Telephone Encounter (Signed)
RD called pt to verify fluid intake once starting soft, solid proteins 2 week post-bariatric surgery.   Daily Fluid intake: 64+ oz Daily Protein intake: fell short the past couple of days, stating she could only get 2 bites of chicken in all day. Bowel Habits: daily to every other day  Concerns/issues: Pt states she struggling with chicken and is falling short with her protein goal the past couple of days, stating she can't get the chicken down no matter how she prepares it. Dietitian advised not to keep trying chicken. Try again in several weeks. Use protein shakes to supplement as needed, increasing solid protein that you can tolerate. Pt states she can do beef, fish, tuna. Dietitian advised that we are not expected to tolerate all proteins/foods this soon after surgery.

## 2023-03-26 ENCOUNTER — Encounter: Payer: BC Managed Care – PPO | Attending: Surgery | Admitting: Dietician

## 2023-03-26 ENCOUNTER — Encounter: Payer: Self-pay | Admitting: Dietician

## 2023-03-26 VITALS — Ht 67.0 in | Wt 246.8 lb

## 2023-03-26 DIAGNOSIS — E669 Obesity, unspecified: Secondary | ICD-10-CM | POA: Insufficient documentation

## 2023-03-26 NOTE — Progress Notes (Signed)
3 Month Post-Operative Nutrition Class   Patient was seen on 03/26/2023 for Post-Operative Nutrition education at the [facility].    Surgery date: 12/25/2022 Surgery type: RYGB  Start weight at [facility]: 294.8 lbs Height: 67 in  Weight today: 246.8 lbs   Body Composition Scale 01/09/2023 03/26/2023  Current Body Weight 281 246.8  Total Body Fat % 45 41.7  Visceral Fat 12 10  Fat-Free Mass % 54.9 58.2   Total Body Water % 41.9 43.6  Muscle-Mass lbs 36.1 35.7  BMI 43.7 38.4  Body Fat Displacement           Torso  lbs 78.6 63.8         Left Leg  lbs 15.7 12.7         Right Leg  lbs 15.7 12.7         Left Arm  lbs 7.8 6.3         Right Arm  lbs 7.8 6.3   The following the learning objectives were met by the patient during this course: Information Reviewed/ Discussed Review of composition scale numbers Fluid requirements (64-100 ounces) Protein requirements (60-80g) Strategies for tolerating diet Barriers to inclusion of new foods Inclusion of appropriate multivitamin and calcium supplements  Exercise recommendations  Identifies appropriate sources of fluids, proteins, vegetables, fruits and starches Identifies appropriate fat sources and healthy verses unhealthy fat types   States protein, vegetable, fruit and starch recommendations and appropriate sources post-operatively Identifies the need for appropriate texture modifications, mastication, and bite sizes when consuming solids Identifies appropriate fat consumption and sources Identifies appropriate multivitamin and calcium sources post-operatively Describes the need for physical activity post-operatively and will follow MD recommendations   Handouts given during class include: Standard Prep Plan Advancement Guide   Follow-Up Plan: Patient will follow-up at [facility] in February for next nutrition visit.

## 2023-04-11 ENCOUNTER — Ambulatory Visit: Payer: BC Managed Care – PPO | Admitting: Skilled Nursing Facility1

## 2023-07-24 ENCOUNTER — Encounter: Payer: BC Managed Care – PPO | Attending: Physician Assistant | Admitting: Skilled Nursing Facility1

## 2023-07-24 VITALS — Wt 211.5 lb

## 2023-07-24 DIAGNOSIS — E669 Obesity, unspecified: Secondary | ICD-10-CM | POA: Insufficient documentation

## 2023-07-25 ENCOUNTER — Encounter: Payer: Self-pay | Admitting: Skilled Nursing Facility1

## 2023-07-25 NOTE — Progress Notes (Signed)
Follow-up visit:  Post-Operative RYGB Surgery  Medical Nutrition Therapy:  Appt start time: 6:00pm end time:  7:00pm  Primary concerns today: Post-operative Bariatric Surgery Nutrition Management 6 Month Post-Op Class  Surgery date: 12/25/2022 Surgery type: RYGB  Start weight at [facility]: 294.8 lbs Height: 67 in  Weight today: 211.5 lbs  Body Composition Scale 07/24/2023  Weight  lbs 211.5  Total Body Fat  % 37.1     Visceral Fat 8  Fat-Free Mass  % 62.8     Total Body Water  % 45.9     Muscle-Mass  lbs 35.4  BMI 32.8  Body Fat Displacement ---        Torso  lbs 48.6        Left Leg  lbs 9.7        Right Leg  lbs 9.7        Left Arm  lbs 4.8        Right Arm  lbs 4.8     Information Reviewed/ Discussed During Appointment: -Review of composition scale numbers -Fluid requirements (64-100 ounces) -Protein requirements (60-80g) -Strategies for tolerating diet -Advancement of diet to include Starchy vegetables -Barriers to inclusion of new foods -Inclusion of appropriate multivitamin and calcium supplements  -Exercise recommendations   Fluid intake: adequate   Medications: See List Supplementation: appropriate   Using straws: no Drinking while eating: no Having you been chewing well: yes Chewing/swallowing difficulties: no Changes in vision: no Changes to mood/headaches: no Hair loss/Cahnges to skin/Changes to nails: no Any difficulty focusing or concentrating: no Sweating: no Dizziness/Lightheaded: no Palpitations: no  Carbonated beverages: no N/V/D/C/GAS: no Abdominal Pain: no Dumping syndrome: no  Recent physical activity:  ADL's  Progress Towards Goal(s):  In Progress Teaching method utilized: Visual & Auditory  Demonstrated degree of understanding via: Teach Back  Readiness Level: Action Barriers to learning/adherence to lifestyle change: none identified  Handouts given during visit include: Phase V diet Progression  Goals Sheet The Benefits of  Exercise are endless..... Support Group Topics    Teaching Method Utilized:  Visual Auditory Hands on  Demonstrated degree of understanding via:  Teach Back   Monitoring/Evaluation:  Dietary intake, exercise, and body weight. Follow up in 3 months for 9 month post-op visit.

## 2023-10-22 ENCOUNTER — Encounter: Payer: BC Managed Care – PPO | Attending: Physician Assistant | Admitting: Skilled Nursing Facility1

## 2023-10-22 ENCOUNTER — Encounter: Payer: Self-pay | Admitting: Skilled Nursing Facility1

## 2023-10-22 VITALS — Wt 191.6 lb

## 2023-10-22 DIAGNOSIS — E669 Obesity, unspecified: Secondary | ICD-10-CM | POA: Diagnosis present

## 2023-10-22 NOTE — Progress Notes (Unsigned)
 Follow-up visit:  Post-Operative RYGB Surgery  Medical Nutrition Therapy:  Appt start time: 5:11pm end time:  5:45  Primary concerns today: Post-operative Bariatric Surgery Nutrition Management   Surgery date: 12/25/2022 Surgery type: RYGB  Start weight at [facility]: 294.8 lbs Height: 67 in  Weight today: 191.6 lbs  Body Composition Scale 07/24/2023 10/22/2023  Weight  lbs 211.5 191.6  Total Body Fat  % 37.1 33.9     Visceral Fat 8 7  Fat-Free Mass  % 62.8 66     Total Body Water   % 45.9 47.5     Muscle-Mass  lbs 35.4 35.1  BMI 32.8 29.7  Body Fat Displacement ---         Torso  lbs 48.6 40.1        Left Leg  lbs 9.7 8.0        Right Leg  lbs 9.7 8.0        Left Arm  lbs 4.8 4.0        Right Arm  lbs 4.8 4.0   24 hr recall: wakes around 7:45 First meal 9am: frozen breakfast burrito  Snack: Second: chicken apple sausage + egg + cucumber + cheese + grapes Snack:chocolate rice cake and mashmellow fluff or chips Third: orange chicken   Beverages: coffee  Pt states she worked 73 hours this past week so has been tired. Pt states she will get nausea and shaky if she does not eat often enough.  Pt states she has not been meeting her fluid goal.   Fluid intake: adequate   Medications: See List Supplementation: appropriate   Using straws: no Drinking while eating: no Having you been chewing well: yes Chewing/swallowing difficulties: no Changes in vision: no Changes to mood/headaches: no Hair loss/Cahnges to skin/Changes to nails: no Any difficulty focusing or concentrating: no Sweating: no Dizziness/Lightheaded: no Palpitations: no  Carbonated beverages: no N/V/D/C/GAS: no Abdominal Pain: no Dumping syndrome: no  Recent physical activity:  gym: cardio and muscle group: 3 days a week 30-45 minutes   Progress Towards Goal(s):  In Progress Teaching method utilized: Patent attorney & Auditory  Demonstrated degree of understanding via: Teach Back  Readiness Level:  Action Barriers to learning/adherence to lifestyle change: none identified   Teaching Method Utilized:  Visual Auditory Hands on  Demonstrated degree of understanding via:  Teach Back   Monitoring/Evaluation:  Dietary intake, exercise, and body weight. Follow up in early August

## 2024-07-18 ENCOUNTER — Encounter (HOSPITAL_COMMUNITY): Payer: Self-pay | Admitting: *Deleted
